# Patient Record
Sex: Male | Born: 1985 | Race: White | Hispanic: No | Marital: Single | Smoking: Current every day smoker
Health system: Southern US, Community
[De-identification: ages and names within clinical notes are randomized; demographics above are authoritative.]

## PROBLEM LIST (undated history)

## (undated) ENCOUNTER — Ambulatory Visit (INDEPENDENT_AMBULATORY_CARE_PROVIDER_SITE_OTHER): Source: Home / Self Care

## (undated) DIAGNOSIS — I10 Essential (primary) hypertension: Secondary | ICD-10-CM

## (undated) HISTORY — PX: ANKLE SURGERY: SHX546

## (undated) HISTORY — PX: WRIST SURGERY: SHX841

---

## 2006-12-15 ENCOUNTER — Emergency Department (HOSPITAL_COMMUNITY): Admission: EM | Admit: 2006-12-15 | Discharge: 2006-12-15 | Payer: Self-pay | Admitting: Emergency Medicine

## 2008-09-15 ENCOUNTER — Emergency Department (HOSPITAL_COMMUNITY): Admission: EM | Admit: 2008-09-15 | Discharge: 2008-09-15 | Payer: Self-pay | Admitting: Emergency Medicine

## 2009-12-05 ENCOUNTER — Emergency Department (HOSPITAL_COMMUNITY): Admission: EM | Admit: 2009-12-05 | Discharge: 2009-12-05 | Payer: Self-pay | Admitting: Emergency Medicine

## 2009-12-10 ENCOUNTER — Ambulatory Visit (HOSPITAL_COMMUNITY): Admission: RE | Admit: 2009-12-10 | Discharge: 2009-12-10 | Payer: Self-pay | Admitting: Family Medicine

## 2011-04-30 ENCOUNTER — Ambulatory Visit (INDEPENDENT_AMBULATORY_CARE_PROVIDER_SITE_OTHER): Payer: 59 | Admitting: Family Medicine

## 2011-04-30 VITALS — BP 139/98 | HR 86 | Temp 98.2°F | Resp 18 | Ht 69.0 in | Wt 199.0 lb

## 2011-04-30 DIAGNOSIS — M545 Low back pain, unspecified: Secondary | ICD-10-CM

## 2011-04-30 DIAGNOSIS — M546 Pain in thoracic spine: Secondary | ICD-10-CM

## 2011-04-30 MED ORDER — MELOXICAM 7.5 MG PO TABS: 7.5000 mg | ORAL_TABLET | Freq: Two times a day (BID) | ORAL | Status: DC

## 2011-04-30 MED ORDER — HYDROCODONE-ACETAMINOPHEN 5-500 MG PO CAPS: 1.0000 | ORAL_CAPSULE | Freq: Three times a day (TID) | ORAL | Status: AC | PRN

## 2011-04-30 MED ORDER — KETOROLAC TROMETHAMINE 60 MG/2ML IM SOLN
60.0000 mg | Freq: Once | INTRAMUSCULAR | Status: AC
  Administered 2011-04-30: 60 mg via INTRAMUSCULAR

## 2011-04-30 MED ORDER — CYCLOBENZAPRINE HCL 10 MG PO TABS: 10.0000 mg | ORAL_TABLET | Freq: Three times a day (TID) | ORAL | Status: AC | PRN

## 2011-04-30 NOTE — Progress Notes (Signed)
  Subjective:    Patient ID: Dwayne Mason, male    DOB: [birth year], 26 y.o.   MRN: [id number]  HPI  Patient presents with complains of severe (L) upper thoracic spine pain. Helping with a move developed dull symp then yesterday lifted cooler developed  severe searing pain in that area. Pain with ROM   Drank 1 bottle of wine yesterday to help with pain  Denies neck pain or symptoms radiating to (B) UE  SH/ smoker         Review of Systems     Objective:   Physical Exam  Constitutional: He appears well-developed.  Neck: Neck supple.  Cardiovascular: Normal rate, regular rhythm and normal heart sounds.   Pulmonary/Chest: Effort normal and breath sounds normal.  Musculoskeletal:       Thoracic back: He exhibits decreased range of motion, tenderness and spasm.       Back:  Neurological: He is alert. He has normal strength. No sensory deficit.  Reflex Scores:      Bicep reflexes are 2+ on the right side and 2+ on the left side.      Brachioradialis reflexes are 1+ on the right side and 1+ on the left side.         Assessment & Plan:   1. Back pain, thoracic  ketorolac (TORADOL) injection 60 mg, hydrocodone-acetaminophen (LORCET-HD) 5-500 MG per capsule, cyclobenzaprine (FLEXERIL) 10 MG tablet, meloxicam (MOBIC) 7.5 MG tablet   Anticipatory guidance RTC in 3 days if symptoms persist and we'll obtain x rays then Patient in agreement with plan

## 2011-07-31 ENCOUNTER — Ambulatory Visit (INDEPENDENT_AMBULATORY_CARE_PROVIDER_SITE_OTHER): Payer: 59 | Admitting: Emergency Medicine

## 2011-07-31 VITALS — BP 140/90 | HR 84 | Temp 97.9°F | Resp 20 | Ht 69.0 in | Wt 198.0 lb

## 2011-07-31 DIAGNOSIS — S335XXA Sprain of ligaments of lumbar spine, initial encounter: Secondary | ICD-10-CM

## 2011-07-31 DIAGNOSIS — M546 Pain in thoracic spine: Secondary | ICD-10-CM

## 2011-07-31 MED ORDER — CYCLOBENZAPRINE HCL 10 MG PO TABS: 10.0000 mg | ORAL_TABLET | Freq: Three times a day (TID) | ORAL | Status: AC | PRN

## 2011-07-31 MED ORDER — MELOXICAM 7.5 MG PO TABS: 7.5000 mg | ORAL_TABLET | Freq: Two times a day (BID) | ORAL | Status: AC

## 2011-07-31 MED ORDER — KETOROLAC TROMETHAMINE 60 MG/2ML IM SOLN
60.0000 mg | Freq: Once | INTRAMUSCULAR | Status: AC
  Administered 2011-07-31: 60 mg via INTRAMUSCULAR

## 2011-07-31 MED ORDER — HYDROCODONE-ACETAMINOPHEN 5-500 MG PO TABS: 1.0000 | ORAL_TABLET | ORAL | Status: AC | PRN
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
## 2012-08-08 ENCOUNTER — Emergency Department (HOSPITAL_COMMUNITY): Payer: 59

## 2012-08-08 ENCOUNTER — Emergency Department (HOSPITAL_COMMUNITY)
Admission: EM | Admit: 2012-08-08 | Discharge: 2012-08-09 | Disposition: A | Discharge status: Home / Self Care | Payer: 59 | Attending: Emergency Medicine | Admitting: Emergency Medicine

## 2012-08-08 ENCOUNTER — Encounter (HOSPITAL_COMMUNITY): Payer: Self-pay | Admitting: Emergency Medicine

## 2012-08-08 DIAGNOSIS — S66909A Unspecified injury of unspecified muscle, fascia and tendon at wrist and hand level, unspecified hand, initial encounter: Secondary | ICD-10-CM | POA: Insufficient documentation

## 2012-08-08 DIAGNOSIS — F17200 Nicotine dependence, unspecified, uncomplicated: Secondary | ICD-10-CM | POA: Insufficient documentation

## 2012-08-08 DIAGNOSIS — S61509A Unspecified open wound of unspecified wrist, initial encounter: Secondary | ICD-10-CM | POA: Insufficient documentation

## 2012-08-08 DIAGNOSIS — S61511A Laceration without foreign body of right wrist, initial encounter: Secondary | ICD-10-CM

## 2012-08-08 DIAGNOSIS — Y929 Unspecified place or not applicable: Secondary | ICD-10-CM | POA: Insufficient documentation

## 2012-08-08 DIAGNOSIS — W268XXA Contact with other sharp object(s), not elsewhere classified, initial encounter: Secondary | ICD-10-CM | POA: Insufficient documentation

## 2012-08-08 DIAGNOSIS — Y939 Activity, unspecified: Secondary | ICD-10-CM | POA: Insufficient documentation

## 2012-08-08 DIAGNOSIS — R209 Unspecified disturbances of skin sensation: Secondary | ICD-10-CM | POA: Insufficient documentation

## 2012-08-08 MED ORDER — SODIUM CHLORIDE 0.9 % IV SOLN
Freq: Once | INTRAVENOUS | Status: AC
  Administered 2012-08-08: via INTRAVENOUS

## 2012-08-08 MED ORDER — ONDANSETRON HCL 4 MG/2ML IJ SOLN
4.0000 mg | Freq: Once | INTRAMUSCULAR | Status: AC
  Administered 2012-08-08: 4 mg via INTRAVENOUS
  Filled 2012-08-08: qty 2

## 2012-08-08 MED ORDER — MORPHINE SULFATE 4 MG/ML IJ SOLN
4.0000 mg | Freq: Once | INTRAMUSCULAR | Status: AC
  Administered 2012-08-08: 4 mg via INTRAVENOUS
  Filled 2012-08-08: qty 1

## 2012-08-08 MED ORDER — CEFAZOLIN SODIUM 1-5 GM-% IV SOLN
1.0000 g | Freq: Once | INTRAVENOUS | Status: AC
  Administered 2012-08-09: 1 g via INTRAVENOUS
  Filled 2012-08-08: qty 50

## 2012-08-08 NOTE — ED Notes (Signed)
PT. PRESENTS WITH RIGHT LATERAL WRIST LACERATION APPROX. 1 1/2 INCH SUSTAINED WHILE WASHING DISHES THIS EVENING , ETOH INTAKE PTA , DRESSING APPLIED AT TRIAGE .

## 2012-08-09 MED ORDER — HYDROCODONE-ACETAMINOPHEN 5-325 MG PO TABS: 1.0000 | ORAL_TABLET | ORAL | Status: DC | PRN

## 2012-08-09 MED ORDER — CEPHALEXIN 500 MG PO CAPS: 500.0000 mg | ORAL_CAPSULE | Freq: Four times a day (QID) | ORAL | Status: DC

## 2012-08-09 NOTE — ED Provider Notes (Signed)
CSN: [id number]     Arrival date & time 08/08/12  2157 History     First MD Initiated Contact with Patient 08/08/12 2302     Chief Complaint  Patient presents with  . Laceration   (Consider location/radiation/quality/duration/timing/severity/associated sxs/prior Treatment) HPI Comments: Patient is a 4 cm laceration to the dorsal aspect of his right forearm, proximally, 4 cm above the wrist joint on the radial aspect.  He does have some delay flexion at the wrist.  He has numbness and tingling to the thumb and index, finger.  Circulation appears to be normal moderate amount of bleeding to the lateral aspect of the wound.  Controlled with pressure  Patient is a 27 y.o. male presenting with skin laceration. The history is provided by the patient.  Laceration Location:  Shoulder/arm Shoulder/arm laceration location:  R forearm Length (cm):  4 Depth:  Through underlying tissue Quality: straight   Bleeding: controlled with pressure   Time since incident:  2 hours Laceration mechanism:  Broken glass Pain details:    Quality:  Aching   Severity:  Mild   Timing:  Constant Foreign body present:  No foreign bodies Relieved by:  Pressure Tetanus status:  Up to date   History reviewed. No pertinent past medical history. Past Surgical History  Procedure Laterality Date  . Ankle surgery     No family history on file. History  Substance Use Topics  . Smoking status: Current Every Day Smoker  . Smokeless tobacco: Not on file  . Alcohol Use: 0.0 oz/week     Comment: weekends    Review of Systems  Skin: Positive for wound.  Neurological: Positive for numbness.  All other systems reviewed and are negative.    Allergies  Review of patient's allergies indicates no known allergies.  Home Medications   Current Outpatient Rx  Name  Route  Sig  Dispense  Refill  . cephALEXin (KEFLEX) 500 MG capsule   Oral   Take 1 capsule (500 mg total) by mouth 4 (four) times daily.   40  capsule   0   . HYDROcodone-acetaminophen (NORCO/VICODIN) 5-325 MG per tablet   Oral   Take 1 tablet by mouth every 4 (four) hours as needed for pain.   30 tablet   0    BP 138/97  Pulse 109  Temp(Src) 98.3 F (36.8 C) (Oral)  Resp 20  SpO2 95% Physical Exam  Nursing note and vitals reviewed. Constitutional: He is oriented to person, place, and time. He appears well-developed and well-nourished.  Eyes: Pupils are equal, round, and reactive to light.  Cardiovascular: Normal rate and regular rhythm.   Pulmonary/Chest: Effort normal and breath sounds normal.  Musculoskeletal: He exhibits tenderness. He exhibits no edema.       Right hand: Decreased sensation noted. Decreased sensation is present in the radial distribution. Decreased strength noted. He exhibits wrist extension trouble.       Hands: 4 cm laceration Numbness and tingling to the thumb and index, finger, for range of motion of the thumb and finger.  Delay extension at the wrist.  On visualization of the laceration.  It appears that the radius, cough, or radius longus tendon.  Has been partially severed  Neurological: He is alert and oriented to person, place, and time.  Skin: Skin is warm.    ED Course   LACERATION REPAIR Date/Time: 08/09/2012 12:34 AM Performed by: Arman Filter Authorized by: Arman Filter Consent: Verbal consent obtained. Consent given by: patient  Patient understanding: patient states understanding of the procedure being performed Patient identity confirmed: verbally with patient Foreign bodies: no foreign bodies Tendon involvement: superficial Nerve involvement: superficial Vascular damage: no Anesthesia: local infiltration Local anesthetic: lidocaine 1% with epinephrine Anesthetic total: 5 ml Patient sedated: no Preparation: Patient was prepped and draped in the usual sterile fashion. Irrigation solution: saline Irrigation method: syringe Amount of cleaning: extensive Debridement:  none Degree of undermining: none Skin closure: 3-0 Prolene Number of sutures: 4 Technique: simple Approximation: loose Approximation difficulty: simple Dressing: antibiotic ointment, 4x4 sterile gauze and splint Patient tolerance: Patient tolerated the procedure well with no immediate complications. Comments: After suturing, dressing and splint applied.  Patient retained Refill in all 5 fingers.  Pain  has been decreased   (including critical care time)  Labs Reviewed - No data to display Dg Wrist Complete Right  08/08/2012   *RADIOLOGY REPORT*  Clinical Data: Right wrist laceration.  RIGHT WRIST - COMPLETE 3+ VIEW  Comparison: None.  Findings: Bandage material is present over the radial aspect of the wrist.  There is no fracture or radiopaque foreign body.  Anatomic alignment.  IMPRESSION: No acute osseous abnormality.   Original Report Authenticated By: Andreas Newport, M.D.   1. Laceration of wrist with complication, right, initial encounter     MDM   I spoke with Dr. Dairl Ponder, to discuss closure.  He agrees a loose closure.  Irrigation IV antibiotic in the form of Ancef 1 g.  Patient said to be placed in a splint to decrease motion.  Followup in the office in the morning  Arman Filter, NP 08/09/12 [phone]

## 2012-08-09 NOTE — ED Notes (Signed)
Pt given glass of water per PA approval.

## 2012-08-09 NOTE — Progress Notes (Signed)
Orthopedic Tech Progress Note Patient Details:  Dwayne Mason 1985/05/10 [id number]  Ortho Devices Type of Ortho Device: Thumb spica splint   Haskell Flirt 08/09/2012, 12:26 AM

## 2012-08-10 NOTE — ED Provider Notes (Signed)
Medical screening examination/treatment/procedure(s) were performed by non-physician practitioner and as supervising physician I was immediately available for consultation/collaboration.  Sunnie Nielsen, MD 08/10/12 [phone]

## 2013-03-15 ENCOUNTER — Ambulatory Visit (INDEPENDENT_AMBULATORY_CARE_PROVIDER_SITE_OTHER): Payer: 59 | Admitting: Family Medicine

## 2013-03-15 ENCOUNTER — Ambulatory Visit: Payer: 59

## 2013-03-15 VITALS — BP 104/82 | HR 78 | Temp 98.6°F | Resp 16 | Ht 69.0 in | Wt 224.4 lb

## 2013-03-15 DIAGNOSIS — M109 Gout, unspecified: Secondary | ICD-10-CM

## 2013-03-15 DIAGNOSIS — M79609 Pain in unspecified limb: Secondary | ICD-10-CM

## 2013-03-15 DIAGNOSIS — M79671 Pain in right foot: Secondary | ICD-10-CM

## 2013-03-15 LAB — POCT CBC
GRANULOCYTE PERCENT: 67.7 % (ref 37–80)
HCT, POC: 50.6 % (ref 43.5–53.7)
Hemoglobin: 16.9 g/dL (ref 14.1–18.1)
Lymph, poc: 1.7 (ref 0.6–3.4)
MCH, POC: 31.5 pg — AB (ref 27–31.2)
MCHC: 33.4 g/dL (ref 31.8–35.4)
MCV: 94.2 fL (ref 80–97)
MID (CBC): 0.8 (ref 0–0.9)
MPV: 9 fL (ref 0–99.8)
PLATELET COUNT, POC: 233 10*3/uL (ref 142–424)
POC GRANULOCYTE: 5.3 (ref 2–6.9)
POC LYMPH %: 22 % (ref 10–50)
POC MID %: 10.3 %M (ref 0–12)
RBC: 5.37 M/uL (ref 4.69–6.13)
RDW, POC: 13.7 %
WBC: 7.8 10*3/uL (ref 4.6–10.2)

## 2013-03-15 LAB — COMPREHENSIVE METABOLIC PANEL
ALBUMIN: 4.4 g/dL (ref 3.5–5.2)
ALK PHOS: 68 U/L (ref 39–117)
ALT: 24 U/L (ref 0–53)
AST: 20 U/L (ref 0–37)
BUN: 15 mg/dL (ref 6–23)
CALCIUM: 9.8 mg/dL (ref 8.4–10.5)
CHLORIDE: 103 meq/L (ref 96–112)
CO2: 27 mEq/L (ref 19–32)
Creat: 0.84 mg/dL (ref 0.50–1.35)
Glucose, Bld: 87 mg/dL (ref 70–99)
POTASSIUM: 5 meq/L (ref 3.5–5.3)
SODIUM: 137 meq/L (ref 135–145)
TOTAL PROTEIN: 7.7 g/dL (ref 6.0–8.3)
Total Bilirubin: 0.6 mg/dL (ref 0.2–1.2)

## 2013-03-15 LAB — URIC ACID: Uric Acid, Serum: 7 mg/dL (ref 4.0–7.8)

## 2013-03-15 MED ORDER — INDOMETHACIN 50 MG PO CAPS: 50.0000 mg | ORAL_CAPSULE | Freq: Three times a day (TID) | ORAL | Status: DC

## 2013-03-15 NOTE — Progress Notes (Signed)
Subjective: 28 year old man who started having intense pain in his right foot between the third and fourth toe joints when he got up yesterday morning. He says he may have had a little pain the day before but nothing to think about. He did have a heavy meat meal with beer the day before. 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 he did work yesterday though he was miserable. He knows of no specific injury. He wears steel toed boots when he is at work. Healthy person, nonemergent meds.  Objective: His feet have a little bit of a red flush around the base of the foot and some dryness. He is extremely tender at the second and third MTP joint. Lateral pressure did not seem to cause a lot of pain. Direct palpation or plantar flexion of the toes hurts terribly. Pulses intact  Assessment: Foot pain, suspicious for gout. I doubt Morton's neuroma as the pain came on little too abruptly and lateral pressure does not cause more pain.  Plan: X-ray foot CBC C. met and uric acid level  Results for orders placed in visit on 03/15/13  POCT CBC      Result Value Ref Range   WBC 7.8  4.6 - 10.2 K/uL   Lymph, poc 1.7  0.6 - 3.4   POC LYMPH PERCENT 22.0  10 - 50 %L   MID (cbc) 0.8  0 - 0.9   POC MID % 10.3  0 - 12 %M   POC Granulocyte 5.3  2 - 6.9   Granulocyte percent 67.7  37 - 80 %G   RBC 5.37  4.69 - 6.13 M/uL   Hemoglobin 16.9  14.1 - 18.1 g/dL   HCT, POC 50.6  43.5 - 53.7 %   MCV 94.2  80 - 97 fL   MCH, POC 31.5 (*) 27 - 31.2 pg   MCHC 33.4  31.8 - 35.4 g/dL   RDW, POC 13.7     Platelet Count, POC 233  142 - 424 K/uL   MPV 9.0  0 - 99.8 fL   [facility] reading (PRIMARY) by  Dr. Linna Darner Normal x-ray of foot  .

## 2013-03-15 NOTE — Patient Instructions (Signed)
Take indomethacin one pill 3 times daily with food. When the foot has been calmed down for a couple of days cut back to 2 pills daily for a few days then one daily for another few days, then discontinue it.   If symptoms recur or are getting worse despite treatment return for a recheck  Gout Gout is an inflammatory arthritis caused by a buildup of uric acid crystals in the joints. Uric acid is a chemical that is normally present in the blood. When the level of uric acid in the blood is too high it can form crystals that deposit in your joints and tissues. This causes joint redness, soreness, and swelling (inflammation). Repeat attacks are common. Over time, uric acid crystals can form into masses (tophi) near a joint, destroying bone and causing disfigurement. Gout is treatable and often preventable. CAUSES  The disease begins with elevated levels of uric acid in the blood. Uric acid is produced by your body when it breaks down a naturally found substance called purines. Certain foods you eat, such as meats and fish, contain high amounts of purines. Causes of an elevated uric acid level include:  Being passed down from parent to child (heredity).  Diseases that cause increased uric acid production (such as obesity, psoriasis, and certain cancers).  Excessive alcohol use.  Diet, especially diets rich in meat and seafood.  Medicines, including certain cancer-fighting medicines (chemotherapy), water pills (diuretics), and aspirin.  Chronic kidney disease. The kidneys are no longer able to remove uric acid well.  Problems with metabolism. Conditions strongly associated with gout include:  Obesity.  High blood pressure.  High cholesterol.  Diabetes. Not everyone with elevated uric acid levels gets gout. It is not understood why some people get gout and others do not. Surgery, joint injury, and eating too much of certain foods are some of the factors that can lead to gout attacks. SYMPTOMS     An attack of gout comes on quickly. It causes intense pain with redness, swelling, and warmth in a joint.  Fever can occur.  Often, only one joint is involved. Certain joints are more commonly involved:  Base of the big toe.  Knee.  Ankle.  Wrist.  Finger. Without treatment, an attack usually goes away in a few days to weeks. Between attacks, you usually will not have symptoms, which is different from many other forms of arthritis. DIAGNOSIS  Your caregiver will suspect gout based on your symptoms and exam. In some cases, tests may be recommended. The tests may include:  Blood tests.  Urine tests.  X-rays.  Joint fluid exam. This exam requires a needle to remove fluid from the joint (arthrocentesis). Using a microscope, gout is confirmed when uric acid crystals are seen in the joint fluid. TREATMENT  There are two phases to gout treatment: treating the sudden onset (acute) attack and preventing attacks (prophylaxis).  Treatment of an Acute Attack.  Medicines are used. These include anti-inflammatory medicines or steroid medicines.  An injection of steroid medicine into the affected joint is sometimes necessary.  The painful joint is rested. Movement can worsen the arthritis.  You may use warm or cold treatments on painful joints, depending which works best for you.  Treatment to Prevent Attacks.  If you suffer from frequent gout attacks, your caregiver may advise preventive medicine. These medicines are started after the acute attack subsides. These medicines either help your kidneys eliminate uric acid from your body or decrease your uric acid production. You may  need to stay on these medicines for a very long time.  The early phase of treatment with preventive medicine can be associated with an increase in acute gout attacks. For this reason, during the first few months of treatment, your caregiver may also advise you to take medicines usually used for acute gout  treatment. Be sure you understand your caregiver's directions. Your caregiver may make several adjustments to your medicine dose before these medicines are effective.  Discuss dietary treatment with your caregiver or dietitian. Alcohol and drinks high in sugar and fructose and foods such as meat, poultry, and seafood can increase uric acid levels. Your caregiver or dietician can advise you on drinks and foods that should be limited. HOME CARE INSTRUCTIONS   Do not take aspirin to relieve pain. This raises uric acid levels.  Only take over-the-counter or prescription medicines for pain, discomfort, or fever as directed by your caregiver.  Rest the joint as much as possible. When in bed, keep sheets and blankets off painful areas.  Keep the affected joint raised (elevated).  Apply warm or cold treatments to painful joints. Use of warm or cold treatments depends on which works best for you.  Use crutches if the painful joint is in your leg.  Drink enough fluids to keep your urine clear or pale yellow. This helps your body get rid of uric acid. Limit alcohol, sugary drinks, and fructose drinks.  Follow your dietary instructions. Pay careful attention to the amount of protein you eat. Your daily diet should emphasize fruits, vegetables, whole grains, and fat-free or low-fat milk products. Discuss the use of coffee, vitamin C, and cherries with your caregiver or dietician. These may be helpful in lowering uric acid levels.  Maintain a healthy body weight. SEEK MEDICAL CARE IF:   You develop diarrhea, vomiting, or any side effects from medicines.  You do not feel better in 24 hours, or you are getting worse. SEEK IMMEDIATE MEDICAL CARE IF:   Your joint becomes suddenly more tender, and you have chills or a fever. MAKE SURE YOU:   Understand these instructions.  Will watch your condition.  Will get help right away if you are not doing well or get worse. Document Released: 12/27/1999  Document Revised: 04/25/2012 Document Reviewed: 08/12/2011 [facility] Patient Information 2014 [employer] [place], [place].

## 2013-03-17 ENCOUNTER — Encounter: Payer: Self-pay | Admitting: Family Medicine

## 2014-03-22 ENCOUNTER — Ambulatory Visit (INDEPENDENT_AMBULATORY_CARE_PROVIDER_SITE_OTHER): Payer: 59 | Admitting: Physician Assistant

## 2014-03-22 VITALS — BP 114/72 | HR 70 | Temp 97.8°F | Resp 16 | Ht 69.24 in | Wt 208.0 lb

## 2014-03-22 DIAGNOSIS — M791 Myalgia, unspecified site: Secondary | ICD-10-CM

## 2014-03-22 DIAGNOSIS — H938X3 Other specified disorders of ear, bilateral: Secondary | ICD-10-CM | POA: Diagnosis not present

## 2014-03-22 DIAGNOSIS — R070 Pain in throat: Secondary | ICD-10-CM | POA: Diagnosis not present

## 2014-03-22 DIAGNOSIS — J069 Acute upper respiratory infection, unspecified: Secondary | ICD-10-CM

## 2014-03-22 LAB — POCT CBC
Granulocyte percent: 82.2 %G — AB (ref 37–80)
HEMATOCRIT: 55 % — AB (ref 43.5–53.7)
HEMOGLOBIN: 18 g/dL (ref 14.1–18.1)
LYMPH, POC: 1.9 (ref 0.6–3.4)
MCH, POC: 30.4 pg (ref 27–31.2)
MCHC: 32.7 g/dL (ref 31.8–35.4)
MCV: 93.1 fL (ref 80–97)
MID (CBC): 0.6 (ref 0–0.9)
MPV: 7.7 fL (ref 0–99.8)
PLATELET COUNT, POC: 207 10*3/uL (ref 142–424)
POC GRANULOCYTE: 11.4 — AB (ref 2–6.9)
POC LYMPH PERCENT: 13.8 %L (ref 10–50)
POC MID %: 4 % (ref 0–12)
RBC: 5.91 M/uL (ref 4.69–6.13)
RDW, POC: 14.1 %
WBC: 13.9 10*3/uL — AB (ref 4.6–10.2)

## 2014-03-22 LAB — POCT RAPID STREP A (OFFICE): RAPID STREP A SCREEN: NEGATIVE

## 2014-03-22 MED ORDER — GUAIFENESIN ER 1200 MG PO TB12: 1.0000 | ORAL_TABLET | Freq: Two times a day (BID) | ORAL | Status: DC | PRN

## 2014-03-22 MED ORDER — AMOXICILLIN-POT CLAVULANATE 875-125 MG PO TABS: 1.0000 | ORAL_TABLET | Freq: Two times a day (BID) | ORAL | Status: AC

## 2014-03-22 NOTE — Patient Instructions (Signed)
Please drink plenty of fluids 4 regular sized water bottles (64oz).    Please return if you are not feeling any improvement and worsening within 48 hours.  Watch for alarming signs such as trouble breathing, fever, shortness of breath.

## 2014-03-22 NOTE — Progress Notes (Signed)
[facility] [address], [place] [id number] [phone]  Date:  03/22/2014   Name:  Dwayne Mason   DOB:  Apr 18, 1985   MRN:  [id number]  PCP:  No PCP Per Patient    Chief Complaint: Influenza; Fatigue; Sore Throat; Generalized Body Aches; and Nasal Congestion   History of Present Illness:  Dwayne Mason is a 29 y.o. very pleasant male patient who presents with the following:  Patient reports 12 days of flu-like symptoms including sore throat, generalized body aches, nasal congestion, and ear fullness.  Patient states that the symptoms started 12 days ago, and he visited health care facility of [facility] and tested positive for influenza.  Patient states that the symptoms continued, but seemed to be resolving.  Yesterday, the body aches returned with a mild sore throat and body ache.  He has nasal congestion, but very little coughing.  He denies rash or dizziness.  He has no sob or dyspnea.  He has episodes of diarrhea without blood.  He has taken Nyquil, dayquil, and ibuprofen for relief.   Patient reports HIV testing, 1 month ago.    There are no active problems to display for this patient.   History reviewed. No pertinent past medical history.  Past Surgical History  Procedure Laterality Date  . Ankle surgery    . Wrist surgery      Nerve Repair, right wrist    History  Substance Use Topics  . Smoking status: Current Every Day Smoker  . Smokeless tobacco: Not on file  . Alcohol Use: 0.0 oz/week     Comment: weekends    Family History  Problem Relation Age of Onset  . Adopted: Yes    No Known Allergies  Medication list has been reviewed and updated.  No current outpatient prescriptions on file prior to visit.   No current facility-administered medications on file prior to visit.    Review of Systems: ROS otherwise unremarkable unless listed above.    Physical Examination: Filed Vitals:   03/22/14 1109  BP: 154/98  Pulse: 45  Temp:  97.8 F (36.6 C)  Resp: 16   Filed Vitals:   03/22/14 1109  Height: 5' 9.24" (1.759 m)  Weight: 208 lb (94.348 kg)   BP 114/72 mmHg  Pulse 70  Temp(Src) 97.8 F (36.6 C) (Oral)  Resp 16  Ht 5' 9.24" (1.759 m)  Wt 208 lb (94.348 kg)  BMI 30.49 kg/m2  SpO2 98%  Body mass index is 30.49 kg/(m^2). Ideal Body Weight: Weight in (lb) to have BMI = 25: 170.1  Physical Exam  Constitutional: He appears well-developed and well-nourished. No distress.  HENT:  Head: Normocephalic and atraumatic.  Right Ear: Tympanic membrane, external ear and ear canal normal. Tympanic membrane is not injected and not erythematous. No middle ear effusion.  Left Ear: Tympanic membrane is injected. Tympanic membrane is not scarred and not erythematous. A middle ear effusion is present.  Nose: Mucosal edema and rhinorrhea present. Right sinus exhibits no maxillary sinus tenderness and no frontal sinus tenderness. Left sinus exhibits no maxillary sinus tenderness and no frontal sinus tenderness.  Mouth/Throat: No uvula swelling. Posterior oropharyngeal erythema present. No oropharyngeal exudate or posterior oropharyngeal edema.  Eyes: EOM are normal. Pupils are equal, round, and reactive to light. Right eye exhibits no discharge. Left eye exhibits no discharge.  Skin: Skin is warm.  Psychiatric: He has a normal mood and affect. His behavior is normal.    Results for  orders placed or performed in visit on 03/22/14  POCT CBC  Result Value Ref Range   WBC 13.9 (A) 4.6 - 10.2 K/uL   Lymph, poc 1.9 0.6 - 3.4   POC LYMPH PERCENT 13.8 10 - 50 %L   MID (cbc) 0.6 0 - 0.9   POC MID % 4.0 0 - 12 %M   POC Granulocyte 11.4 (A) 2 - 6.9   Granulocyte percent 82.2 (A) 37 - 80 %G   RBC 5.91 4.69 - 6.13 M/uL   Hemoglobin 18.0 14.1 - 18.1 g/dL   HCT, POC 16.1 (A) 09.6 - 53.7 %   MCV 93.1 80 - 97 fL   MCH, POC 30.4 27 - 31.2 pg   MCHC 32.7 31.8 - 35.4 g/dL   RDW, POC 04.5 %   Platelet Count, POC 207 142 - 424 K/uL    MPV 7.7 0 - 99.8 fL  POCT rapid strep A  Result Value Ref Range   Rapid Strep A Screen Negative Negative     Assessment and Plan: 29 year old male is here today for chief complaint of ear fullness, sore throat, nasal congestion, and generalized body aches.  Lungs and vs are normal with reevaluation.  Will treat as upper respiratory infection.  Advised patient of alarming sxs of uncontrolled fever, sob, or dyspnea, which may become present with hydration.  Advised patient for smoking cessation, which likely complicated flu.    Generalized muscle ache - Plan: POCT CBC, POCT rapid strep A  Ear fullness, bilateral - Plan: POCT CBC, POCT rapid strep A  Throat pain in adult - Plan: POCT rapid strep A, Culture, Group A Strep  Upper respiratory infection - Plan: amoxicillin-clavulanate (AUGMENTIN) 875-125 MG per tablet, Guaifenesin (MUCINEX MAXIMUM STRENGTH) 1200 MG TB12  Trena Platt, PA-C [facility] 3/10/20169:02 PM

## 2014-03-24 LAB — CULTURE, GROUP A STREP: ORGANISM ID, BACTERIA: NORMAL

## 2014-12-20 ENCOUNTER — Ambulatory Visit (INDEPENDENT_AMBULATORY_CARE_PROVIDER_SITE_OTHER): Payer: 59 | Admitting: Family Medicine

## 2014-12-20 VITALS — BP 128/88 | HR 89 | Temp 98.1°F | Resp 16 | Ht 69.0 in | Wt 213.6 lb

## 2014-12-20 DIAGNOSIS — F32A Depression, unspecified: Secondary | ICD-10-CM

## 2014-12-20 DIAGNOSIS — F329 Major depressive disorder, single episode, unspecified: Secondary | ICD-10-CM

## 2014-12-20 DIAGNOSIS — F4001 Agoraphobia with panic disorder: Secondary | ICD-10-CM | POA: Diagnosis not present

## 2014-12-20 DIAGNOSIS — F4010 Social phobia, unspecified: Secondary | ICD-10-CM | POA: Diagnosis not present

## 2014-12-20 DIAGNOSIS — F309 Manic episode, unspecified: Secondary | ICD-10-CM | POA: Diagnosis not present

## 2014-12-20 LAB — TSH: TSH: 0.665 u[IU]/mL (ref 0.350–4.500)

## 2014-12-20 NOTE — Progress Notes (Addendum)
Subjective:  By signing my name below, I, Rawaa Al Rifaie, attest that this documentation has been prepared under the direction and in the presence of Blaire Hodsdon, MD.  Dwayne Mason, Medical ScribeMeredith Staggers.  1:36 PM.  I personally performed the services described in this documentation, which was scribed in my presence. The recorded information has been reviewed and considered, and addended by me as needed.    Patient ID: Dwayne Mason, male    DOB: 1985-05-25, 29 y.o.   MRN: [id number]  Chief Complaint  Patient presents with  . tested for Bi-polar disorder  . Depression    triage screening    HPI HPI Comments: Dwayne Mason is a 29 y.o. male who presents to [facility] for a referral due to a concern that he might be experiencing  bi polar disorder.   History-Pt has a history of generalized anxiety with agoraphobia. He states that he tries to avoid being involved in social situation such as crowded places, or being in new or unfamiliar areas. He states that he wants to live on a [place] by himself. Pt indicates that he noticed that his symptoms have been feeling different during the past year, especially during the winter time. Pt does live action D&D for pleasure   Symptoms- He notes that his symptoms area unpredictable in the sense that  when he wakes up in the morning he is either having much energy and motivation and he goes about his day feeling like that, and other days when he is depressed. He notes that during his very energized days he experiences inability to sleep and he usually turns to drinking excessive alcohol, "whatever he can fit in there", in addition to having episodes of irrational spending. Pt experiences these episodes about once per week every 2 weeks. He denies feeling that he is alcoholic, having DUI's, or being told that his performance has been suffering. Pt also reports that he has been experiencing panic attacks more often, and  decreased concentration that is not being able to do his work efficiently. He indicates however that these symptoms are keeping him from being able to go about his normal daily or work activities. Pt does not take any medications for his symptoms.  Pt denies suicidal thoughts, or thoughts of hurting or harming others, hallucinations, A/V/T hallucinations or delusions. I discussed benzodiazepine, but he preferred to not have this prescribed at this time until he follows up with a psychiatric.   Life events and family- Pt also reports that he has experienced major life events within the past few years such as ending a 5 year relationship about 2 years ago, and her reports a death in the family, his aunt, whom he was not really close to. Pt notes that he is not very close to any family members other than his grandparents and his twin brother. Pt states that his mother was never in the picture, and his father who had mental issues such as being suicidal has been in and out of his life.  Pt notes that his uncle committed suicide few years ago. Pt has a family history of bi polar disorders (father, and his deceased uncle). Pt does not believe that his brother is experiencing any mental health issues.   Work- Pt works in Surveyor, mining at [employer]. He started this job about 1.5 years ago. He notes that he never really enjoys doing desk jobs.    Depression screen [facility] 2/9 12/20/2014  Decreased Interest 2  Down, Depressed, Hopeless 1  PHQ - 2 Score 3  Altered sleeping 2  Tired, decreased energy 1  Change in appetite 0  Feeling bad or failure about yourself  1  Trouble concentrating 2  Moving slowly or fidgety/restless 2  Suicidal thoughts 0  PHQ-9 Score 11  Difficult doing work/chores Very difficult    There are no active problems to display for this patient.  History reviewed. No pertinent past medical history. Past Surgical History  Procedure Laterality Date  . Ankle surgery      . Wrist surgery      Nerve Repair, right wrist   No Known Allergies Prior to Admission medications   Medication Sig Start Date End Date Taking? Authorizing Provider  Guaifenesin (MUCINEX MAXIMUM STRENGTH) 1200 MG TB12 Take 1 tablet (1,200 mg total) by mouth every 12 (twelve) hours as needed. Patient not taking: Reported on 12/20/2014 03/22/14   Garnetta BuddyStephanie D English, PA   Social History   Social History  . Marital Status: Single    Spouse Name: N/A  . Number of Children: N/A  . Years of Education: N/A   Occupational History  . Not on file.   Social History Main Topics  . Smoking status: Current Every Day Smoker  . Smokeless tobacco: Not on file  . Alcohol Use: 0.0 oz/week     Comment: weekends  . Drug Use: No  . Sexual Activity: Not on file   Other Topics Concern  . Not on file   Social History Narrative    Review of Systems  Psychiatric/Behavioral: Positive for behavioral problems, dysphoric mood and decreased concentration. Negative for suicidal ideas, hallucinations and self-injury. The patient is nervous/anxious and is hyperactive.       Objective:   Physical Exam  Constitutional: He is oriented to person, place, and time. He appears well-developed and well-nourished. No distress.  HENT:  Head: Normocephalic and atraumatic.  Eyes: EOM are normal. Pupils are equal, round, and reactive to light.  Neck: Neck supple.  Cardiovascular: Normal rate.   Pulmonary/Chest: Effort normal.  Neurological: He is alert and oriented to person, place, and time. No cranial nerve deficit.  Skin: Skin is warm and dry.  Psychiatric: He has a normal mood and affect. His behavior is normal.  Faire eye contact with pyscomotor agitation though tout visit. Denies SI/HI.  Nursing note and vitals reviewed.   Filed Vitals:   12/20/14 1242  BP: 128/88  Pulse: 89  Temp: 98.1 F (36.7 C)  TempSrc: Oral  Resp: 16  Height: 5\' 9"  (1.753 m)  Weight: 213 lb 9.6 oz (96.888 kg)  SpO2: 98%        Assessment & Plan:  Dwayne MullerRyan Mason is a 29 y.o. male Manic episode (HCC) - Plan: TSH, Ambulatory referral to Psychiatry  Depression - Plan: TSH, Ambulatory referral to Psychiatry  Panic disorder with agoraphobia - Plan: TSH, Ambulatory referral to Psychiatry  Social anxiety disorder - Plan: TSH, Ambulatory referral to Psychiatry   Dwayne Mason may have a combination of diagnoses, including suspected bipolar disorder with family history of the same, underlying social anxiety disorder and panic disorder, and depressive episodes. However intermittent episodes of manic type symptoms, so suspect at least some bipolar disorder. Some decreased focus and productivity work, but he has not had any difficulties with his employer at present. Denies suicidal ideation or homicidal ideation. No psychotic symptoms. Initially discussed possible low-dose benzodiazepine short course for panic symptoms, but this was declined at present.  We'll check TSH, refer to psychiatry, also gave numbers for psychologist/counseling given his family situation and its possible impact on his current symptoms. RTC precautions discussed.   No orders of the defined types were placed in this encounter.   Patient Instructions  I will refer you to psychiatrist to further evaluate your symptoms, but I agree that your symptoms do sound like Bipolar disorder. However, you also have some symptoms of social anxiety and panic disorder. There are some medicines that can help with these symptoms, so let me know if you would like to try one of these prior to meeting with psychiatrist. Also recommend calling one of the counselors below to discuss your symptoms as well. Return to the clinic or go to the nearest emergency room if any of your symptoms worsen or new symptoms occur.  Karmen Bongo: [phone] [place] Ped: [phone]  Bipolar Disorder Bipolar disorder is a mental illness. The term bipolar disorder actually is used to describe a group  of disorders that all share varying degrees of emotional highs and lows that can interfere with daily functioning, such as work, school, or relationships. Bipolar disorder also can lead to drug abuse, hospitalization, and suicide. The emotional highs of bipolar disorder are periods of elation or irritability and high energy. These highs can range from a mild form (hypomania) to a severe form (mania). People experiencing episodes of hypomania may appear energetic, excitable, and highly productive. People experiencing mania may behave impulsively or erratically. They often make poor decisions. They may have difficulty sleeping. The most severe episodes of mania can involve having very distorted beliefs or perceptions about the world and seeing or hearing things that are not real (psychotic delusions and hallucinations).  The emotional lows of bipolar disorder (depression) also can range from mild to severe. Severe episodes of bipolar depression can involve psychotic delusions and hallucinations. Sometimes people with bipolar disorder experience a state of mixed mood. Symptoms of hypomania or mania and depression are both present during this mixed-mood episode. SIGNS AND SYMPTOMS There are signs and symptoms of the episodes of hypomania and mania as well as the episodes of depression. The signs and symptoms of hypomania and mania are similar but vary in severity. They include:  Inflated self-esteem or feeling of increased self-confidence.  Decreased need for sleep.  Unusual talkativeness (rapid or pressured speech) or the feeling of a need to keep talking.  Sensation of racing thoughts or constant talking, with quick shifts between topics that may or may not be related (flight of ideas).  Decreased ability to focus or concentrate.  Increased purposeful activity, such as work, studies, or social activity, or nonproductive activity, such as pacing, squirming and fidgeting, or finger and toe  tapping.  Impulsive behavior and use of poor judgment, resulting in high-risk activities, such as having unprotected sex or spending excessive amounts of money. Signs and symptoms of depression include the following:   Feelings of sadness, hopelessness, or helplessness.  Frequent or uncontrollable episodes of crying.  Lack of feeling anything or caring about anything.  Difficulty sleeping or sleeping too much.  Inability to enjoy the things you used to enjoy.   Desire to be alone all the time.   Feelings of guilt or worthlessness.  Lack of energy or motivation.   Difficulty concentrating, remembering, or making decisions.  Change in appetite or weight beyond normal fluctuations.  Thoughts of death or the desire to harm yourself. DIAGNOSIS  Bipolar disorder is diagnosed through an assessment by your caregiver.  Your caregiver will ask questions about your emotional episodes. There are two main types of bipolar disorder. People with type I bipolar disorder have manic episodes with or without depressive episodes. People with type II bipolar disorder have hypomanic episodes and major depressive episodes, which are more serious than mild depression. The type of bipolar disorder you have can make an important difference in how your illness is monitored and treated. Your caregiver may ask questions about your medical history and use of alcohol or drugs, including prescription medication. Certain medical conditions and substances also can cause emotional highs and lows that resemble bipolar disorder (secondary bipolar disorder).  TREATMENT  Bipolar disorder is a long-term illness. It is best controlled with continuous treatment rather than treatment only when symptoms occur. The following treatments can be prescribed for bipolar disorders:  Medication--Medication can be prescribed by a doctor that is an expert in treating mental disorders (psychiatrists). Medications called mood  stabilizers are usually prescribed to help control the illness. Other medications are sometimes added if symptoms of mania, depression, or psychotic delusions and hallucinations occur despite the use of a mood stabilizer.  Talk therapy--Some forms of talk therapy are helpful in providing support, education, and guidance. A combination of medication and talk therapy is best for managing the disorder over time. A procedure in which electricity is applied to your brain through your scalp (electroconvulsive therapy) is used in cases of severe mania when medication and talk therapy do not work or work too slowly.   This information is not intended to replace advice given to you by your health care provider. Make sure you discuss any questions you have with your health care provider.   Document Released: 04/06/2000 Document Revised: 01/19/2014 Document Reviewed: 01/25/2012 Elsevier Interactive Patient Education 2016 [employer].   Social Anxiety Disorder Social anxiety disorder, previously called social phobia, is a mental disorder. People with social anxiety disorder frequently feel nervous, afraid, or embarrassed when around other people in social situations. They constantly worry that other people are judging or criticizing them for how they look, what they say, or how they act. They may worry that other people might reject them because of their appearance or behavior. Social anxiety disorder is more than just occasional shyness or self-consciousness. It can cause severe emotional distress. It can interfere with daily life activities. Social anxiety disorder also may lead to excessive alcohol or drug use and even suicide.  Social anxiety disorder is actually one of the most common mental disorders. It can develop at any time but usually starts in the teenage years. Women are more commonly affected than men. Social anxiety disorder is also more common in people who have family members with anxiety  disorders. It also is more common in people who have physical deformities or conditions with characteristics that are obvious to others, such as stuttered speech or movement abnormalities (Parkinson disease).  SYMPTOMS  In addition to feeling anxious or fearful in social situations, people with social anxiety disorder frequently have physical symptoms. Examples include:  Red face (blushing).  Racing heart.  Sweating.  Shaky hands or voice.  Confusion.  Light-headedness.  Upset stomach and diarrhea. DIAGNOSIS  Social anxiety disorder is diagnosed through an assessment by your health care provider. Your health care provider will ask you questions about your mood, thoughts, and reactions in social situations. Your health care provider may ask you about your medical history and use of alcohol or drugs, including prescription medicines. Certain medical conditions and the use  of certain substances, including caffeine, can cause symptoms similar to social anxiety disorder. Your health care provider may refer you to a mental health specialist for further evaluation or treatment. The criteria for diagnosis of social anxiety disorder are:  Marked fear or anxiety in one or more social situations in which you may be closely watched or studied by others. Examples of such situations include:  Interacting socially (having a conversation with others, going to a party, or meeting strangers).  Being observed (eating or drinking in public or being called on in class).  Performing in front of others (giving a speech).  The social situations of concern almost always cause fear or anxiety, not just occasionally.  People with social anxiety disorder fear that they will be viewed negatively in a way that will be embarrassing, will lead to rejection, or will offend others. This fear is out of proportion to the actual threat posed by the social situation.  Often the triggering social situations are avoided,  or they are endured with intense fear or anxiety. The fear, anxiety, or avoidance is persistent and lasts for 6 months or longer.  The anxiety causes difficulty functioning in at least some parts of your daily life. TREATMENT  Several types of treatment are available for social anxiety disorder. These treatments are often used in combination and include:   Talk therapy. Group talk therapy allows you to see that you are not alone with these problems. Individual talk therapy helps you address your specific anxiety issues with a caring professional. The most effective forms of talk therapy for social anxiety disorder are cognitive-behavioral therapy and exposure therapy. Cognitive-behavioral therapy helps you to identify and change negative thoughts and beliefs that are at the root of the disorder. Exposure therapy allows you to gradually face the situations that you fear most.  Relaxation and coping techniques. These include deep breathing, self-talk, meditation, visual imagery, and yoga. Relaxation techniques help to keep you calm in social situations.  Social Optician, dispensing.Social skills can be learned on your own or with the help of a talk therapist. They can help you feel more confident and comfortable in social situations.  Medicine. For anxiety limited to performance situations (performance anxiety), medicine called beta blockers can help by reducing or preventing the physical symptoms of social anxiety disorder. For more persistent and generalized social anxiety, antidepressant medicine may be prescribed to help control symptoms. In severe cases of social anxiety disorder, strong antianxiety medicine, called benzodiazepines, may be prescribed on a limited basis and for a short time.   This information is not intended to replace advice given to you by your health care provider. Make sure you discuss any questions you have with your health care provider.   Document Released: 11/27/2004 Document  Revised: 01/19/2014 Document Reviewed: 03/29/2012 Elsevier Interactive Patient Education 2016 Else[employer] Inc[employer] Panic Attacks Panic attacks are sudden, short-livedsurges of severe anxiety, fear, or discomfort. They may occur for no reason when you are relaxed, when you are anxious, or when you are sleeping. Panic attacks may occur for a number of reasons:   Healthy people occasionally have panic attacks in extreme, life-threatening situations, such as war or natural disasters. Normal anxiety is a protective mechanism of the body that helps [place] react to danger (fight or flight response).  Panic attacks are often seen with anxiety disorders, such as panic disorder, social anxiety disorder, generalized anxiety disorder, and phobias. Anxiety disorders cause excessive or uncontrollable anxiety. They may interfere with your relationships or other  life activities.  Panic attacks are sometimes seen with other mental illnesses, such as depression and posttraumatic stress disorder.  Certain medical conditions, prescription medicines, and drugs of abuse can cause panic attacks. SYMPTOMS  Panic attacks start suddenly, peak within 20 minutes, and are accompanied by four or more of the following symptoms:  Pounding heart or fast heart rate (palpitations).  Sweating.  Trembling or shaking.  Shortness of breath or feeling smothered.  Feeling choked.  Chest pain or discomfort.  Nausea or strange feeling in your stomach.  Dizziness, light-headedness, or feeling like you will faint.  Chills or hot flushes.  Numbness or tingling in your lips or hands and feet.  Feeling that things are not real or feeling that you are not yourself.  Fear of losing control or going crazy.  Fear of dying. Some of these symptoms can mimic serious medical conditions. For example, you may think you are having a heart attack. Although panic attacks can be very scary, they are not life threatening. DIAGNOSIS  Panic  attacks are diagnosed through an assessment by your health care provider. Your health care provider will ask questions about your symptoms, such as where and when they occurred. Your health care provider will also ask about your medical history and use of alcohol and drugs, including prescription medicines. Your health care provider may order blood tests or other studies to rule out a serious medical condition. Your health care provider may refer you to a mental health professional for further evaluation. TREATMENT   Most healthy people who have one or two panic attacks in an extreme, life-threatening situation will not require treatment.  The treatment for panic attacks associated with anxiety disorders or other mental illness typically involves counseling with a mental health professional, medicine, or a combination of both. Your health care provider will help determine what treatment is best for you.  Panic attacks due to physical illness usually go away with treatment of the illness. If prescription medicine is causing panic attacks, talk with your health care provider about stopping the medicine, decreasing the dose, or substituting another medicine.  Panic attacks due to alcohol or drug abuse go away with abstinence. Some adults need professional help in order to stop drinking or using drugs. HOME CARE INSTRUCTIONS   Take all medicines as directed by your health care provider.   Schedule and attend follow-up visits as directed by your health care provider. It is important to keep all your appointments. SEEK MEDICAL CARE IF:  You are not able to take your medicines as prescribed.  Your symptoms do not improve or get worse. SEEK IMMEDIATE MEDICAL CARE IF:   You experience panic attack symptoms that are different than your usual symptoms.  You have serious thoughts about hurting yourself or others.  You are taking medicine for panic attacks and have a serious side effect. MAKE SURE  YOU:  Understand these instructions.  Will watch your condition.  Will get help right away if you are not doing well or get worse.   This information is not intended to replace advice given to you by your health care provider. Make sure you discuss any questions you have with your health care provider.   Document Released: 12/29/2004 Document Revised: 01/03/2013 Document Reviewed: 08/12/2012 Elsevier Interactive Patient Education [employer]

## 2014-12-20 NOTE — Patient Instructions (Signed)
I will refer you to psychiatrist to further evaluate your symptoms, but I agree that your symptoms do sound like Bipolar disorder. However, you also have some symptoms of social anxiety and panic disorder. There are some medicines that can help with these symptoms, so let me know if you would like to try one of these prior to meeting with psychiatrist. Also recommend calling one of the counselors below to discuss your symptoms as well. Return to the clinic or go to the nearest emergency room if any of your symptoms worsen or new symptoms occur.  Karmen Bongo: [phone] [place] Ped: [phone]  Bipolar Disorder Bipolar disorder is a mental illness. The term bipolar disorder actually is used to describe a group of disorders that all share varying degrees of emotional highs and lows that can interfere with daily functioning, such as work, school, or relationships. Bipolar disorder also can lead to drug abuse, hospitalization, and suicide. The emotional highs of bipolar disorder are periods of elation or irritability and high energy. These highs can range from a mild form (hypomania) to a severe form (mania). People experiencing episodes of hypomania may appear energetic, excitable, and highly productive. People experiencing mania may behave impulsively or erratically. They often make poor decisions. They may have difficulty sleeping. The most severe episodes of mania can involve having very distorted beliefs or perceptions about the world and seeing or hearing things that are not real (psychotic delusions and hallucinations).  The emotional lows of bipolar disorder (depression) also can range from mild to severe. Severe episodes of bipolar depression can involve psychotic delusions and hallucinations. Sometimes people with bipolar disorder experience a state of mixed mood. Symptoms of hypomania or mania and depression are both present during this mixed-mood episode. SIGNS AND SYMPTOMS There are signs and  symptoms of the episodes of hypomania and mania as well as the episodes of depression. The signs and symptoms of hypomania and mania are similar but vary in severity. They include:  Inflated self-esteem or feeling of increased self-confidence.  Decreased need for sleep.  Unusual talkativeness (rapid or pressured speech) or the feeling of a need to keep talking.  Sensation of racing thoughts or constant talking, with quick shifts between topics that may or may not be related (flight of ideas).  Decreased ability to focus or concentrate.  Increased purposeful activity, such as work, studies, or social activity, or nonproductive activity, such as pacing, squirming and fidgeting, or finger and toe tapping.  Impulsive behavior and use of poor judgment, resulting in high-risk activities, such as having unprotected sex or spending excessive amounts of money. Signs and symptoms of depression include the following:   Feelings of sadness, hopelessness, or helplessness.  Frequent or uncontrollable episodes of crying.  Lack of feeling anything or caring about anything.  Difficulty sleeping or sleeping too much.  Inability to enjoy the things you used to enjoy.   Desire to be alone all the time.   Feelings of guilt or worthlessness.  Lack of energy or motivation.   Difficulty concentrating, remembering, or making decisions.  Change in appetite or weight beyond normal fluctuations.  Thoughts of death or the desire to harm yourself. DIAGNOSIS  Bipolar disorder is diagnosed through an assessment by your caregiver. Your caregiver will ask questions about your emotional episodes. There are two main types of bipolar disorder. People with type I bipolar disorder have manic episodes with or without depressive episodes. People with type II bipolar disorder have hypomanic episodes and major depressive episodes, which  are more serious than mild depression. The type of bipolar disorder you have  can make an important difference in how your illness is monitored and treated. Your caregiver may ask questions about your medical history and use of alcohol or drugs, including prescription medication. Certain medical conditions and substances also can cause emotional highs and lows that resemble bipolar disorder (secondary bipolar disorder).  TREATMENT  Bipolar disorder is a long-term illness. It is best controlled with continuous treatment rather than treatment only when symptoms occur. The following treatments can be prescribed for bipolar disorders:  Medication--Medication can be prescribed by a doctor that is an expert in treating mental disorders (psychiatrists). Medications called mood stabilizers are usually prescribed to help control the illness. Other medications are sometimes added if symptoms of mania, depression, or psychotic delusions and hallucinations occur despite the use of a mood stabilizer.  Talk therapy--Some forms of talk therapy are helpful in providing support, education, and guidance. A combination of medication and talk therapy is best for managing the disorder over time. A procedure in which electricity is applied to your brain through your scalp (electroconvulsive therapy) is used in cases of severe mania when medication and talk therapy do not work or work too slowly.   This information is not intended to replace advice given to you by your health care provider. Make sure you discuss any questions you have with your health care provider.   Document Released: 04/06/2000 Document Revised: 01/19/2014 Document Reviewed: 01/25/2012 Elsevier Interactive Patient Education 2016 [employer].   Social Anxiety Disorder Social anxiety disorder, previously called social phobia, is a mental disorder. People with social anxiety disorder frequently feel nervous, afraid, or embarrassed when around other people in social situations. They constantly worry that other people are judging  or criticizing them for how they look, what they say, or how they act. They may worry that other people might reject them because of their appearance or behavior. Social anxiety disorder is more than just occasional shyness or self-consciousness. It can cause severe emotional distress. It can interfere with daily life activities. Social anxiety disorder also may lead to excessive alcohol or drug use and even suicide.  Social anxiety disorder is actually one of the most common mental disorders. It can develop at any time but usually starts in the teenage years. Women are more commonly affected than men. Social anxiety disorder is also more common in people who have family members with anxiety disorders. It also is more common in people who have physical deformities or conditions with characteristics that are obvious to others, such as stuttered speech or movement abnormalities (Parkinson disease).  SYMPTOMS  In addition to feeling anxious or fearful in social situations, people with social anxiety disorder frequently have physical symptoms. Examples include:  Red face (blushing).  Racing heart.  Sweating.  Shaky hands or voice.  Confusion.  Light-headedness.  Upset stomach and diarrhea. DIAGNOSIS  Social anxiety disorder is diagnosed through an assessment by your health care provider. Your health care provider will ask you questions about your mood, thoughts, and reactions in social situations. Your health care provider may ask you about your medical history and use of alcohol or drugs, including prescription medicines. Certain medical conditions and the use of certain substances, including caffeine, can cause symptoms similar to social anxiety disorder. Your health care provider may refer you to a mental health specialist for further evaluation or treatment. The criteria for diagnosis of social anxiety disorder are:  Marked fear or anxiety in  one or more social situations in which you may be  closely watched or studied by others. Examples of such situations include:  Interacting socially (having a conversation with others, going to a party, or meeting strangers).  Being observed (eating or drinking in public or being called on in class).  Performing in front of others (giving a speech).  The social situations of concern almost always cause fear or anxiety, not just occasionally.  People with social anxiety disorder fear that they will be viewed negatively in a way that will be embarrassing, will lead to rejection, or will offend others. This fear is out of proportion to the actual threat posed by the social situation.  Often the triggering social situations are avoided, or they are endured with intense fear or anxiety. The fear, anxiety, or avoidance is persistent and lasts for 6 months or longer.  The anxiety causes difficulty functioning in at least some parts of your daily life. TREATMENT  Several types of treatment are available for social anxiety disorder. These treatments are often used in combination and include:   Talk therapy. Group talk therapy allows you to see that you are not alone with these problems. Individual talk therapy helps you address your specific anxiety issues with a caring professional. The most effective forms of talk therapy for social anxiety disorder are cognitive-behavioral therapy and exposure therapy. Cognitive-behavioral therapy helps you to identify and change negative thoughts and beliefs that are at the root of the disorder. Exposure therapy allows you to gradually face the situations that you fear most.  Relaxation and coping techniques. These include deep breathing, self-talk, meditation, visual imagery, and yoga. Relaxation techniques help to keep you calm in social situations.  Social Optician, dispensingskills training.Social skills can be learned on your own or with the help of a talk therapist. They can help you feel more confident and comfortable in social  situations.  Medicine. For anxiety limited to performance situations (performance anxiety), medicine called beta blockers can help by reducing or preventing the physical symptoms of social anxiety disorder. For more persistent and generalized social anxiety, antidepressant medicine may be prescribed to help control symptoms. In severe cases of social anxiety disorder, strong antianxiety medicine, called benzodiazepines, may be prescribed on a limited basis and for a short time.   This information is not intended to replace advice given to you by your health care provider. Make sure you discuss any questions you have with your health care provider.   Document Released: 11/27/2004 Document Revised: 01/19/2014 Document Reviewed: 03/29/2012 Elsevier Interactive Patient Education 2016 [employer] Panic Attacks Panic attacks are sudden, short-livedsurges of severe anxiety, fear, or discomfort. They may occur for no reason when you are relaxed, when you are anxious, or when you are sleeping. Panic attacks may occur for a number of reasons:   Healthy people occasionally have panic attacks in extreme, life-threatening situations, such as war or natural disasters. Normal anxiety is a protective mechanism of the body that helps us react to danger (fight or flight response).  Panic attacks are often seen with anxiety disorders, such as panic disorder, social anxiety disorder, generalized anxiety disorder, and phobias. Anxiety disorders cause excessive or uncontrollable anxiety. They may interfere with your relationships or other life activities.  Panic attacks are sometimes seen with other mental illnesses, such as depression and posttraumatic stress disorder.  Certain medical conditions, prescription medicines, and drugs of abuse can cause panic attacks. SYMPTOMS  Panic attacks start suddenly, peak within 20 minutes, and are  accompanied by four or more of the following symptoms:  Pounding heart or fast  heart rate (palpitations).  Sweating.  Trembling or shaking.  Shortness of breath or feeling smothered.  Feeling choked.  Chest pain or discomfort.  Nausea or strange feeling in your stomach.  Dizziness, light-headedness, or feeling like you will faint.  Chills or hot flushes.  Numbness or tingling in your lips or hands and feet.  Feeling that things are not real or feeling that you are not yourself.  Fear of losing control or going crazy.  Fear of dying. Some of these symptoms can mimic serious medical conditions. For example, you may think you are having a heart attack. Although panic attacks can be very scary, they are not life threatening. DIAGNOSIS  Panic attacks are diagnosed through an assessment by your health care provider. Your health care provider will ask questions about your symptoms, such as where and when they occurred. Your health care provider will also ask about your medical history and use of alcohol and drugs, including prescription medicines. Your health care provider may order blood tests or other studies to rule out a serious medical condition. Your health care provider may refer you to a mental health professional for further evaluation. TREATMENT   Most healthy people who have one or two panic attacks in an extreme, life-threatening situation will not require treatment.  The treatment for panic attacks associated with anxiety disorders or other mental illness typically involves counseling with a mental health professional, medicine, or a combination of both. Your health care provider will help determine what treatment is best for you.  Panic attacks due to physical illness usually go away with treatment of the illness. If prescription medicine is causing panic attacks, talk with your health care provider about stopping the medicine, decreasing the dose, or substituting another medicine.  Panic attacks due to alcohol or drug abuse go away with abstinence.  Some adults need professional help in order to stop drinking or using drugs. HOME CARE INSTRUCTIONS   Take all medicines as directed by your health care provider.   Schedule and attend follow-up visits as directed by your health care provider. It is important to keep all your appointments. SEEK MEDICAL CARE IF:  You are not able to take your medicines as prescribed.  Your symptoms do not improve or get worse. SEEK IMMEDIATE MEDICAL CARE IF:   You experience panic attack symptoms that are different than your usual symptoms.  You have serious thoughts about hurting yourself or others.  You are taking medicine for panic attacks and have a serious side effect. MAKE SURE YOU:  Understand these instructions.  Will watch your condition.  Will get help right away if you are not doing well or get worse.   This information is not intended to replace advice given to you by your health care provider. Make sure you discuss any questions you have with your health care provider.   Document Released: 12/29/2004 Document Revised: 01/03/2013 Document Reviewed: 08/12/2012 Elsevier Interactive Patient Education [employer]

## 2015-05-09 ENCOUNTER — Ambulatory Visit (HOSPITAL_COMMUNITY): Payer: Self-pay | Admitting: Psychiatry

## 2017-01-17 DIAGNOSIS — J019 Acute sinusitis, unspecified: Secondary | ICD-10-CM | POA: Diagnosis not present

## 2017-10-25 ENCOUNTER — Ambulatory Visit (INDEPENDENT_AMBULATORY_CARE_PROVIDER_SITE_OTHER): Payer: 59

## 2017-10-25 ENCOUNTER — Other Ambulatory Visit: Payer: Self-pay

## 2017-10-25 ENCOUNTER — Ambulatory Visit: Payer: 59 | Admitting: Emergency Medicine

## 2017-10-25 ENCOUNTER — Encounter: Payer: Self-pay | Admitting: Emergency Medicine

## 2017-10-25 VITALS — BP 146/84 | HR 104 | Temp 98.3°F | Resp 16 | Wt 244.6 lb

## 2017-10-25 DIAGNOSIS — S93402A Sprain of unspecified ligament of left ankle, initial encounter: Secondary | ICD-10-CM

## 2017-10-25 DIAGNOSIS — M25572 Pain in left ankle and joints of left foot: Secondary | ICD-10-CM | POA: Diagnosis not present

## 2017-10-25 NOTE — Patient Instructions (Addendum)
     If you have lab work done today you will be contacted with your lab results within the next 2 weeks.  If you have not heard from us then please contact us. The fastest way to get your results is to register for My Chart.   IF you received an x-ray today, you will receive an invoice from [facility]. Please contact [facility] at [phone] with questions or concerns regarding your invoice.   IF you received labwork today, you will receive an invoice from [facility]. Please contact [facility] at [phone] with questions or concerns regarding your invoice.   Our billing staff will not be able to assist you with questions regarding bills from these companies.  You will be contacted with the lab results as soon as they are available. The fastest way to get your results is to activate your My Chart account. Instructions are located on the last page of this paperwork. If you have not heard from us regarding the results in 2 weeks, please contact this office.     Ankle Sprain An ankle sprain is a stretch or tear in one of the tough tissues (ligaments) in your ankle. Follow these instructions at home:  Rest your ankle.  Take over-the-counter and prescription medicines only as told by your doctor.  For 2-3 days, keep your ankle higher than the level of your heart (elevated) as much as possible.  If directed, put ice on the area: ? Put ice in a plastic bag. ? Place a towel between your skin and the bag. ? Leave the ice on for 20 minutes, 2-3 times a day.  If you were given a brace: ? Wear it as told. ? Take it off to shower or bathe. ? Try not to move your ankle much, but wiggle your toes from time to time. This helps to prevent swelling.  If you were given an elastic bandage (dressing): ? Take it off when you shower or bathe. ? Try not to move your ankle much, but wiggle your toes from time to time. This helps to prevent swelling. ? Adjust the bandage to make it  more comfortable if it feels too tight. ? Loosen the bandage if you lose feeling in your foot, your foot tingles, or your foot gets cold and blue.  If you have crutches, use them as told by your doctor. Continue to use them until you can walk without feeling pain in your ankle. Contact a doctor if:  Your bruises or swelling are quickly getting worse.  Your pain does not get better after you take medicine. Get help right away if:  You cannot feel your toes or foot.  Your toes or your foot looks blue.  You have very bad pain that gets worse. This information is not intended to replace advice given to you by your health care provider. Make sure you discuss any questions you have with your health care provider. Document Released: 06/17/2007 Document Revised: 06/06/2015 Document Reviewed: 07/31/2014 Elsevier Interactive Patient Education  2018 [employer] Inc.       

## 2017-10-25 NOTE — Progress Notes (Signed)
Dwayne Mason 32 y.o.   Chief Complaint  Patient presents with  . Fall    per patient off the porch last night-injury to LEFT ankle and foot with swelling    HISTORY OF PRESENT ILLNESS: This is a 32 y.o. male complaining of injury to the left ankle sustained last night.  Complaining of pain and swelling.  No other injuries or symptoms.  HPI   Prior to Admission medications   Not on File    No Known Allergies  There are no active problems to display for this patient.   No past medical history on file.  Past Surgical History:  Procedure Laterality Date  . ANKLE SURGERY    . WRIST SURGERY     Nerve Repair, right wrist    Social History   Socioeconomic History  . Marital status: Single    Spouse name: Not on file  . Number of children: Not on file  . Years of education: Not on file  . Highest education level: Not on file  Occupational History  . Not on file  Social Needs  . Financial resource strain: Not on file  . Food insecurity:    Worry: Not on file    Inability: Not on file  . Transportation needs:    Medical: Not on file    Non-medical: Not on file  Tobacco Use  . Smoking status: Current Every Day Smoker  . Smokeless tobacco: Never Used  Substance and Sexual Activity  . Alcohol use: Yes    Comment: weekends  . Drug use: No  . Sexual activity: Not on file  Lifestyle  . Physical activity:    Days per week: Not on file    Minutes per session: Not on file  . Stress: Not on file  Relationships  . Social connections:    Talks on phone: Not on file    Gets together: Not on file    Attends religious service: Not on file    Active member of club or organization: Not on file    Attends meetings of clubs or organizations: Not on file    Relationship status: Not on file  . Intimate partner violence:    Fear of current or ex partner: Not on file    Emotionally abused: Not on file    Physically abused: Not on file    Forced sexual activity: Not on file   Other Topics Concern  . Not on file  Social History Narrative  . Not on file    Family History  Adopted: Yes  Problem Relation Age of Onset  . Mental illness Father      Review of Systems  Constitutional: Negative.  Negative for fever.  Musculoskeletal: Positive for joint pain (Ankle pain).  Skin: Negative.  Negative for rash.  Neurological: Negative for dizziness and headaches.  All other systems reviewed and are negative.  Vitals:   10/25/17 0816  BP: (!) 146/84  Pulse: (!) 104  Resp: 16  Temp: 98.3 F (36.8 C)  SpO2: 96%     Physical Exam  Constitutional: He is oriented to person, place, and time. He appears well-developed and well-nourished.  HENT:  Head: Normocephalic and atraumatic.  Eyes: Pupils are equal, round, and reactive to light. EOM are normal.  Neck: Normal range of motion. Neck supple.  Cardiovascular: Normal rate and regular rhythm.  Pulmonary/Chest: Effort normal.  Musculoskeletal:  Left ankle: Positive bruising, swelling and tenderness lateral ankle with limited range of motion due to  pain.  NVI.  Neurological: He is alert and oriented to person, place, and time.  Skin: Skin is warm and dry. Capillary refill takes less than 2 seconds.  Psychiatric: He has a normal mood and affect. His behavior is normal.  Vitals reviewed.  Dg Ankle Complete Left  Result Date: 10/25/2017 CLINICAL DATA:  Joint pain in left ankle and foot EXAM: LEFT ANKLE COMPLETE - 3+ VIEW COMPARISON:  None. FINDINGS: Plantar calcaneal spur. No acute bony abnormality. Specifically, no fracture, subluxation, or dislocation. IMPRESSION: No acute bony abnormality. Electronically Signed   By: Charlett Nose M.D.   On: 10/25/2017 08:46   A total of 25 minutes was spent in the room with the patient, greater than 50% of which was in counseling/coordination of care regarding x-ray review, management, medications, prognosis and need for follow-up if no better or worse.   ASSESSMENT &  PLAN: Dustin was seen today for fall.  Diagnoses and all orders for this visit:  Pain of joint of left ankle and foot -     DG Ankle Complete Left; Future -     Apply ankle splint air cast  Sprain of left ankle, unspecified ligament, initial encounter    Patient Instructions       If you have lab work done today you will be contacted with your lab results within the next 2 weeks.  If you have not heard from [place] then please contact us. The fastest way to get your results is to register for My Chart.   IF you received an x-ray today, you will receive an invoice from [facility]. Please contact [facility] at [phone] with questions or concerns regarding your invoice.   IF you received labwork today, you will receive an invoice from [facility]. Please contact [facility] at [phone] with questions or concerns regarding your invoice.   Our billing staff will not be able to assist you with questions regarding bills from these companies.  You will be contacted with the lab results as soon as they are available. The fastest way to get your results is to activate your My Chart account. Instructions are located on the last page of this paperwork. If you have not heard from [place] regarding the results in 2 weeks, please contact this office.     Ankle Sprain An ankle sprain is a stretch or tear in one of the tough tissues (ligaments) in your ankle. Follow these instructions at home:  Rest your ankle.  Take over-the-counter and prescription medicines only as told by your doctor.  For 2-3 days, keep your ankle higher than the level of your heart (elevated) as much as possible.  If directed, put ice on the area: ? Put ice in a plastic bag. ? Place a towel between your skin and the bag. ? Leave the ice on for 20 minutes, 2-3 times a day.  If you were given a brace: ? Wear it as told. ? Take it off to shower or bathe. ? Try not to move your ankle much, but wiggle your  toes from time to time. This helps to prevent swelling.  If you were given an elastic bandage (dressing): ? Take it off when you shower or bathe. ? Try not to move your ankle much, but wiggle your toes from time to time. This helps to prevent swelling. ? Adjust the bandage to make it more comfortable if it feels too tight. ? Loosen the bandage if you lose feeling in your foot, your foot  tingles, or your foot gets cold and blue.  If you have crutches, use them as told by your doctor. Continue to use them until you can walk without feeling pain in your ankle. Contact a doctor if:  Your bruises or swelling are quickly getting worse.  Your pain does not get better after you take medicine. Get help right away if:  You cannot feel your toes or foot.  Your toes or your foot looks blue.  You have very bad pain that gets worse. This information is not intended to replace advice given to you by your health care provider. Make sure you discuss any questions you have with your health care provider. Document Released: 06/17/2007 Document Revised: 06/06/2015 Document Reviewed: 07/31/2014 Elsevier Interactive Patient Education  2018 [employer]      Edwina Barth, MD [facility]

## 2018-03-07 DIAGNOSIS — H00019 Hordeolum externum unspecified eye, unspecified eyelid: Secondary | ICD-10-CM | POA: Diagnosis not present

## 2018-03-24 ENCOUNTER — Ambulatory Visit: Payer: Self-pay

## 2018-03-24 DIAGNOSIS — H5789 Other specified disorders of eye and adnexa: Secondary | ICD-10-CM | POA: Diagnosis not present

## 2018-03-24 DIAGNOSIS — H01009 Unspecified blepharitis unspecified eye, unspecified eyelid: Secondary | ICD-10-CM | POA: Diagnosis not present

## 2018-03-24 DIAGNOSIS — Z6839 Body mass index (BMI) 39.0-39.9, adult: Secondary | ICD-10-CM | POA: Diagnosis not present

## 2018-03-24 DIAGNOSIS — H00024 Hordeolum internum left upper eyelid: Secondary | ICD-10-CM | POA: Diagnosis not present

## 2018-03-24 DIAGNOSIS — R030 Elevated blood-pressure reading, without diagnosis of hypertension: Secondary | ICD-10-CM | POA: Diagnosis not present

## 2018-03-24 NOTE — Telephone Encounter (Signed)
Pt called stating that when he go up this AM he had bloody yellow discharge from his left eye.  He states he was seen last week for swelling of his lower eye area at urgent care and give medication. He states that the problem had cleared but has come back to his upper lid swelling.  He denies injury to his eye. He states that the sclera is red and irritated. He has no other symptoms.  Pt will go to urgent care for evaluation of his symptoms. No appointments available with office. Care instructions read to patient. Pt verbalized understanding of all instructions. Reason for Disposition . MODERATE eye pain (e.g., interferes with normal activities)  Answer Assessment - Initial Assessment Questions 1. EYE DISCHARGE: "Is the discharge in one or both eyes?" "What color is it?" "How much is there?" "When did the discharge start?"      Red and yellow left eye with swelling 2. REDNESS OF SCLERA: "Is the redness in one or both eyes?" "When did the redness start?"      Redness to left sclera 3. EYELIDS: "Are the eyelids red or swollen?" If so, ask: "How much?"     Upper left swollen shut in [place] 4. VISION: "Is there any difficulty seeing clearly?"      Spot in vision field 5. PAIN: "Is there any pain? If so, ask: "How bad is it?" (Scale 1-10; or mild, moderate, severe)    - MILD (1-3): doesn't interfere with normal activities     - MODERATE (4-7): interferes with normal activities or awakens from sleep    - SEVERE (8-10): excruciating pain, unable to do any normal activities       Aching to sharp about 7 6. CONTACT LENS: "Do you wear contacts?"     no 7. OTHER SYMPTOMS: "Do you have any other symptoms?" (e.g., fever, runny nose, cough)     no 8. PREGNANCY: "Is there any chance you are pregnant?" "When was your last menstrual period?"     N/A  Protocols used: EYE - PUS OR DISCHARGE-A-AH

## 2018-08-31 ENCOUNTER — Other Ambulatory Visit: Payer: Self-pay

## 2018-08-31 DIAGNOSIS — Z20822 Contact with and (suspected) exposure to covid-19: Secondary | ICD-10-CM

## 2018-09-01 LAB — NOVEL CORONAVIRUS, NAA: SARS-CoV-2, NAA: NOT DETECTED

## 2018-09-01 LAB — SPECIMEN STATUS REPORT

## 2018-10-18 ENCOUNTER — Other Ambulatory Visit: Payer: Self-pay

## 2018-10-18 ENCOUNTER — Ambulatory Visit: Payer: Commercial Managed Care - PPO | Admitting: Emergency Medicine

## 2018-10-18 ENCOUNTER — Encounter: Payer: Self-pay | Admitting: Emergency Medicine

## 2018-10-18 ENCOUNTER — Ambulatory Visit (INDEPENDENT_AMBULATORY_CARE_PROVIDER_SITE_OTHER): Payer: Commercial Managed Care - PPO

## 2018-10-18 VITALS — BP 142/92 | HR 105 | Temp 98.0°F | Ht 69.0 in | Wt 241.0 lb

## 2018-10-18 DIAGNOSIS — S63681A Other sprain of right thumb, initial encounter: Secondary | ICD-10-CM | POA: Diagnosis not present

## 2018-10-18 DIAGNOSIS — M79644 Pain in right finger(s): Secondary | ICD-10-CM | POA: Diagnosis not present

## 2018-10-18 NOTE — Patient Instructions (Addendum)
If you have lab work done today you will be contacted with your lab results within the next 2 weeks.  If you have not heard from [place] then please contact us. The fastest way to get your results is to register for My Chart.   IF you received an x-ray today, you will receive an invoice from [facility]. Please contact [facility] at [phone] with questions or concerns regarding your invoice.   IF you received labwork today, you will receive an invoice from [place]. Please contact [facility] at [phone] with questions or concerns regarding your invoice.   Our billing staff will not be able to assist you with questions regarding bills from these companies.  You will be contacted with the lab results as soon as they are available. The fastest way to get your results is to activate your My Chart account. Instructions are located on the last page of this paperwork. If you have not heard from [place] regarding the results in 2 weeks, please contact this office.     Thumb Sprain  A thumb sprain is an injury to one of the bands of tissue (ligaments) that connect the bones in your thumb. The ligament may be stretched too much, or it may be torn. A tear can be either partial or complete. How bad, or severe, the sprain is depends on how much of the ligament was damaged or torn. What are the causes? A thumb sprain is often caused by a fall or an accident, such as when you hold your hands out to catch something or to protect yourself. What increases the risk? This injury is more likely to occur in people who play sports that involve:  A risk of falling, such as skiing.  Catching an object, such as basketball. What are the signs or symptoms? Symptoms of this condition include:  Not being able to move the thumb normally.  Swelling.  Tenderness.  Bruising. How is this diagnosed? This condition may be diagnosed based on:  Your symptoms and medical history. Your health care  provider may ask about any recent injuries to your thumb.  A physical exam.  Imaging studies such as X-ray, ultrasound, or MRI. How is this treated? Treatment for this condition depends on how severe your sprain is.  If your ligament is overstretched or partially torn, treatment usually involves keeping your thumb in a fixed position (immobilization) for at least 4 to 6 weeks. Your health care provider will apply a bandage, cast, or splint to keep your thumb from moving until it heals.  If your ligament is fully torn, you may need surgery to reconnect the ligament to the bone. After surgery, you will need to wear a cast or splint on your thumb. Your health care provider may also recommend physical therapy to strengthen your thumb. Follow these instructions at home: If you have a splint or bandage:  Wear the splint or bandage as told by your health care provider. Remove it only as told by your health care provider.  Loosen the splint or bandage if your thumb or fingers tingle, become numb, or turn cold and blue.  Keep the splint or bandage clean and dry. If you have a cast:  Do not stick anything inside the cast to scratch your skin. Doing that increases your risk of infection.  Check the skin around the cast every day. Tell your health care provider about any concerns.  You may put lotion on dry skin around the edges  of the cast. Do not put lotion on the skin underneath the cast.  Keep the cast clean and dry. Bathing  Do not take baths, swim, or use a hot tub until your health care provider approves. Ask your health care provider if you may take showers. You may only be allowed to take sponge baths.  If your splint, bandage, or cast is not waterproof: ? Do not let it get wet. ? Cover it with a watertight covering to protect it from water when you take a bath or shower. Managing pain, stiffness, and swelling   If directed, put ice on your thumb: ? If you have a removable  splint, remove it as told by your health care provider. ? Put ice in a plastic bag. ? Place a towel between your skin and the bag, or between your cast and the bag. ? Leave the ice on for 20 minutes, 2-3 times a day.  Move your fingers often to avoid stiffness and to lessen swelling.  Raise (elevate) your hand above the level of your heart while you are sitting or lying down. Activity  Return to your normal activities as told by your health care provider. Ask your health care provider what activities are safe for you.  Do physical therapy exercises as directed. After your splint or cast is removed, your health care provider may recommend that you: ? Move your thumb in circles. ? Touch your thumb to your pinky finger. ? Do these exercises several times a day.  Ask your health care provider if you may use a hand exerciser to strengthen your muscles.  If your thumb feels stiff while you are exercising it, try doing the exercises while soaking your hand in warm water. Driving  Do not drive until your health care provider approves.  Donot drive or use heavy machinery while taking prescription pain medicine. General instructions  Do not put pressure on any part of the cast or splint until it is fully hardened, if applicable. This may take several hours.  Take over-the-counter and prescription medicines only as told by your health care provider.  Do not use any products that contain nicotine or tobacco, such as cigarettes and e-cigarettes. These can delay healing. If you need help quitting, ask your health care provider.  Do not wear rings on your injured thumb.  Keep all follow-up visits as told by your health care provider. This is important. Contact a health care provider if you have:  Pain that gets worse or does not get better with medicine.  Bruising or swelling that gets worse.  Your cast or splint is damaged. Get help right away if:  Your thumb feels numb, tingles, turns  cold, or turns blue, even after loosening your splint or bandage (if applicable). Summary  A thumb sprain is an injury to one of the bands of tissue (ligaments) that connect the bones in your thumb.  Thumb sprains are more likely to occur in people who play sports that involve a risk of falling or having to catch an object.  Treatment will depend on how severe the sprain is, but it will require keeping the thumb in a fixed position. It might require surgery.  Make sure you understand and follow all of your health care provider's instructions for home care. This information is not intended to replace advice given to you by your health care provider. Make sure you discuss any questions you have with your health care provider. Document Released: 02/06/2004 Document Revised:  01/21/2017 Document Reviewed: 01/21/2017 Elsevier Patient Education  2020 [employer].

## 2018-10-18 NOTE — Progress Notes (Signed)
Melene MullerRyan Amble 33 y.o.   Chief Complaint  Patient presents with  . Hand Injury    per patient he thinks Right thumb is broken since Friday nigh    HISTORY OF PRESENT ILLNESS: This is a 33 y.o. male injured right thumb 4 days ago, thinks it might be broken.  HPI   Prior to Admission medications   Not on File    No Known Allergies  There are no active problems to display for this patient.   History reviewed. No pertinent past medical history.  Past Surgical History:  Procedure Laterality Date  . ANKLE SURGERY    . WRIST SURGERY     Nerve Repair, right wrist    Social History   Socioeconomic History  . Marital status: Single    Spouse name: Not on file  . Number of children: Not on file  . Years of education: Not on file  . Highest education level: Not on file  Occupational History  . Not on file  Social Needs  . Financial resource strain: Not on file  . Food insecurity    Worry: Not on file    Inability: Not on file  . Transportation needs    Medical: Not on file    Non-medical: Not on file  Tobacco Use  . Smoking status: Current Every Day Smoker  . Smokeless tobacco: Never Used  Substance and Sexual Activity  . Alcohol use: Yes    Comment: weekends  . Drug use: No  . Sexual activity: Not on file  Lifestyle  . Physical activity    Days per week: Not on file    Minutes per session: Not on file  . Stress: Not on file  Relationships  . Social Musicianconnections    Talks on phone: Not on file    Gets together: Not on file    Attends religious service: Not on file    Active member of club or organization: Not on file    Attends meetings of clubs or organizations: Not on file    Relationship status: Not on file  . Intimate partner violence    Fear of current or ex partner: Not on file    Emotionally abused: Not on file    Physically abused: Not on file    Forced sexual activity: Not on file  Other Topics Concern  . Not on file  Social History Narrative   . Not on file    Family History  Adopted: Yes  Problem Relation Age of Onset  . Mental illness Father      Review of Systems  Constitutional: Negative.  Negative for chills and fever.  HENT: Negative.  Negative for congestion and sore throat.   Respiratory: Negative.  Negative for cough and shortness of breath.   Cardiovascular: Negative.  Negative for chest pain and palpitations.  Gastrointestinal: Negative.  Negative for abdominal pain, diarrhea, nausea and vomiting.  Musculoskeletal: Negative.  Negative for myalgias.  Neurological: Negative.  Negative for dizziness and headaches.  All other systems reviewed and are negative.  Vitals:   10/18/18 1130  BP: (!) 142/92  Pulse: (!) 105  Temp: 98 F (36.7 C)     Physical Exam Constitutional:      Appearance: Normal appearance.  HENT:     Head: Normocephalic.  Eyes:     Extraocular Movements: Extraocular movements intact.     Pupils: Pupils are equal, round, and reactive to light.  Neck:     Musculoskeletal: Normal range  of motion.  Cardiovascular:     Rate and Rhythm: Normal rate.  Pulmonary:     Effort: Pulmonary effort is normal.  Musculoskeletal:     Comments: Right thumb: Some swelling and tenderness proximally with limited range of motion due to pain.  Neurovascularly intact.  Skin:    General: Skin is warm and dry.  Neurological:     General: No focal deficit present.     Mental Status: He is alert and oriented to person, place, and time.  Psychiatric:        Mood and Affect: Mood normal.        Behavior: Behavior normal.    Dg Finger Thumb Right  Result Date: 10/18/2018 CLINICAL DATA:  Right thumb pain.  No known injury. EXAM: RIGHT THUMB 2+V COMPARISON:  None. FINDINGS: There is no evidence of fracture or dislocation. There is no evidence of arthropathy or other focal bone abnormality. Soft tissues are unremarkable. IMPRESSION: Normal exam. Electronically Signed   By: Drusilla Kanner M.D.   On:  10/18/2018 12:00     ASSESSMENT & PLAN: Merdith was seen today for hand injury.  Diagnoses and all orders for this visit:  Other sprain of right thumb, initial encounter  Pain of right thumb -     DG Finger Thumb Right; Future    Patient Instructions       If you have lab work done today you will be contacted with your lab results within the next 2 weeks.  If you have not heard from [place] then please contact us. The fastest way to get your results is to register for My Chart.   IF you received an x-ray today, you will receive an invoice from [facility]. Please contact [facility] at [phone] with questions or concerns regarding your invoice.   IF you received labwork today, you will receive an invoice from [facility]. Please contact [facility] at [phone] with questions or concerns regarding your invoice.   Our billing staff will not be able to assist you with questions regarding bills from these companies.  You will be contacted with the lab results as soon as they are available. The fastest way to get your results is to activate your My Chart account. Instructions are located on the last page of this paperwork. If you have not heard from [place] regarding the results in 2 weeks, please contact this office.     Thumb Sprain  A thumb sprain is an injury to one of the bands of tissue (ligaments) that connect the bones in your thumb. The ligament may be stretched too much, or it may be torn. A tear can be either partial or complete. How bad, or severe, the sprain is depends on how much of the ligament was damaged or torn. What are the causes? A thumb sprain is often caused by a fall or an accident, such as when you hold your hands out to catch something or to protect yourself. What increases the risk? This injury is more likely to occur in people who play sports that involve:  A risk of falling, such as skiing.  Catching an object, such as basketball. What are  the signs or symptoms? Symptoms of this condition include:  Not being able to move the thumb normally.  Swelling.  Tenderness.  Bruising. How is this diagnosed? This condition may be diagnosed based on:  Your symptoms and medical history. Your health care provider may ask about any recent injuries to your thumb.  A physical exam.  Imaging studies such as X-ray, ultrasound, or MRI. How is this treated? Treatment for this condition depends on how severe your sprain is.  If your ligament is overstretched or partially torn, treatment usually involves keeping your thumb in a fixed position (immobilization) for at least 4 to 6 weeks. Your health care provider will apply a bandage, cast, or splint to keep your thumb from moving until it heals.  If your ligament is fully torn, you may need surgery to reconnect the ligament to the bone. After surgery, you will need to wear a cast or splint on your thumb. Your health care provider may also recommend physical therapy to strengthen your thumb. Follow these instructions at home: If you have a splint or bandage:  Wear the splint or bandage as told by your health care provider. Remove it only as told by your health care provider.  Loosen the splint or bandage if your thumb or fingers tingle, become numb, or turn cold and blue.  Keep the splint or bandage clean and dry. If you have a cast:  Do not stick anything inside the cast to scratch your skin. Doing that increases your risk of infection.  Check the skin around the cast every day. Tell your health care provider about any concerns.  You may put lotion on dry skin around the edges of the cast. Do not put lotion on the skin underneath the cast.  Keep the cast clean and dry. Bathing  Do not take baths, swim, or use a hot tub until your health care provider approves. Ask your health care provider if you may take showers. You may only be allowed to take sponge baths.  If your splint,  bandage, or cast is not waterproof: ? Do not let it get wet. ? Cover it with a watertight covering to protect it from water when you take a bath or shower. Managing pain, stiffness, and swelling   If directed, put ice on your thumb: ? If you have a removable splint, remove it as told by your health care provider. ? Put ice in a plastic bag. ? Place a towel between your skin and the bag, or between your cast and the bag. ? Leave the ice on for 20 minutes, 2-3 times a day.  Move your fingers often to avoid stiffness and to lessen swelling.  Raise (elevate) your hand above the level of your heart while you are sitting or lying down. Activity  Return to your normal activities as told by your health care provider. Ask your health care provider what activities are safe for you.  Do physical therapy exercises as directed. After your splint or cast is removed, your health care provider may recommend that you: ? Move your thumb in circles. ? Touch your thumb to your pinky finger. ? Do these exercises several times a day.  Ask your health care provider if you may use a hand exerciser to strengthen your muscles.  If your thumb feels stiff while you are exercising it, try doing the exercises while soaking your hand in warm water. Driving  Do not drive until your health care provider approves.  Donot drive or use heavy machinery while taking prescription pain medicine. General instructions  Do not put pressure on any part of the cast or splint until it is fully hardened, if applicable. This may take several hours.  Take over-the-counter and prescription medicines only as told by your health care provider.  Do not use  any products that contain nicotine or tobacco, such as cigarettes and e-cigarettes. These can delay healing. If you need help quitting, ask your health care provider.  Do not wear rings on your injured thumb.  Keep all follow-up visits as told by your health care provider.  This is important. Contact a health care provider if you have:  Pain that gets worse or does not get better with medicine.  Bruising or swelling that gets worse.  Your cast or splint is damaged. Get help right away if:  Your thumb feels numb, tingles, turns cold, or turns blue, even after loosening your splint or bandage (if applicable). Summary  A thumb sprain is an injury to one of the bands of tissue (ligaments) that connect the bones in your thumb.  Thumb sprains are more likely to occur in people who play sports that involve a risk of falling or having to catch an object.  Treatment will depend on how severe the sprain is, but it will require keeping the thumb in a fixed position. It might require surgery.  Make sure you understand and follow all of your health care provider's instructions for home care. This information is not intended to replace advice given to you by your health care provider. Make sure you discuss any questions you have with your health care provider. Document Released: 02/06/2004 Document Revised: 01/21/2017 Document Reviewed: 01/21/2017 Elsevier Patient Education  2020 Elsevier Inc.      Agustina Caroli, MD [facility]

## 2020-05-23 IMAGING — DX DG FINGER THUMB 2+V*R*
3 series · 3 of 3 positions shown · non-contrast
Comparison: None.

CLINICAL DATA: Right thumb pain.  No known injury.

EXAM:
RIGHT THUMB 2+V

[finger ap]
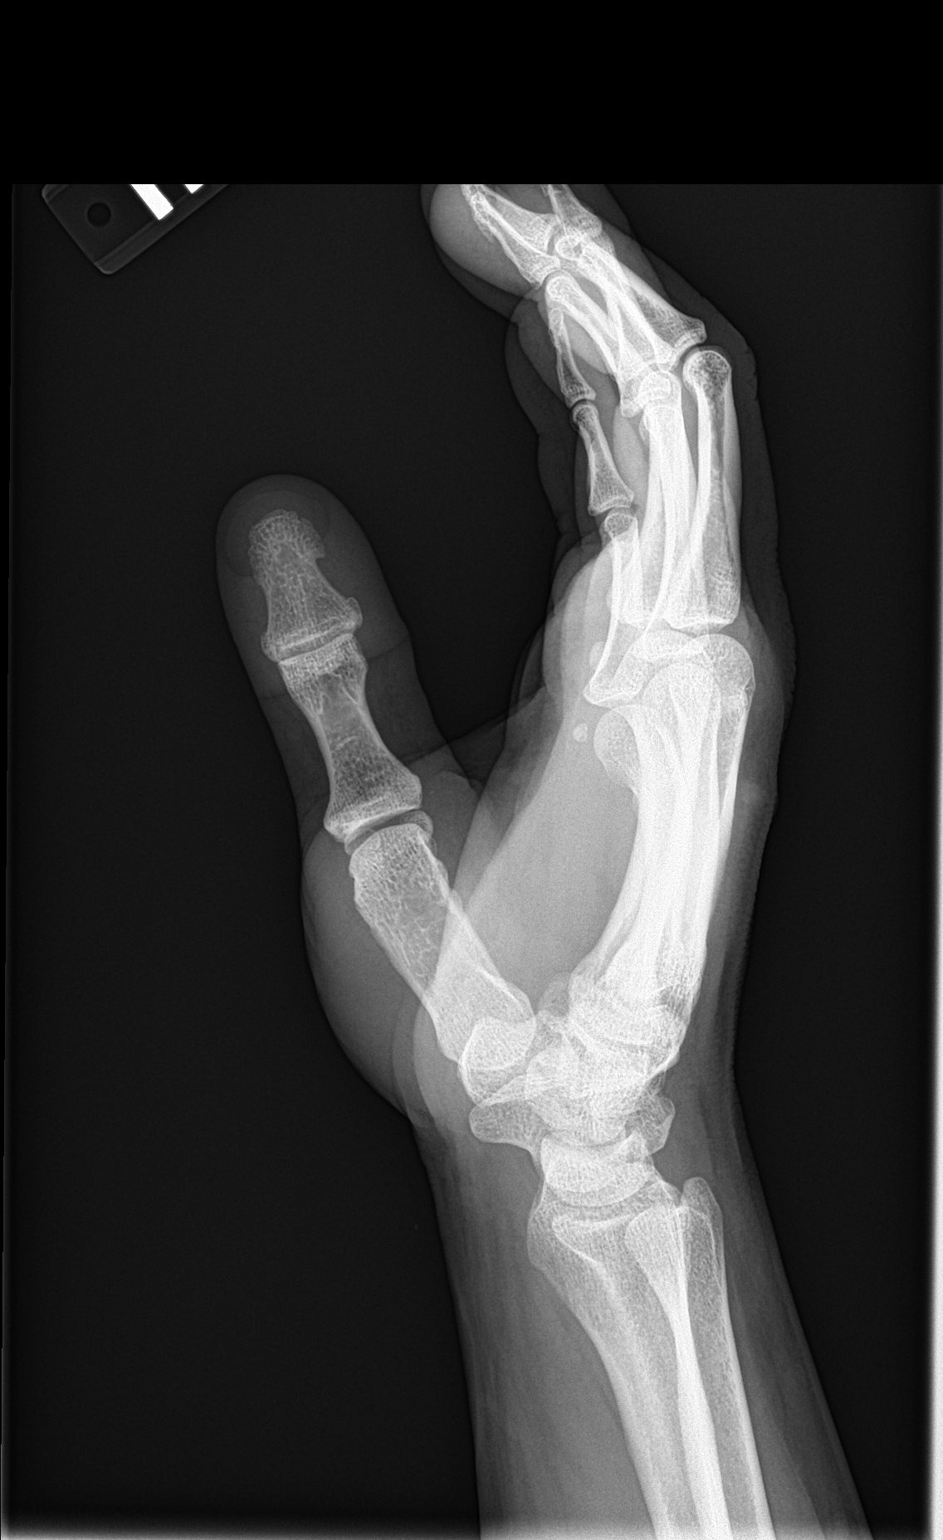

[finger obl]
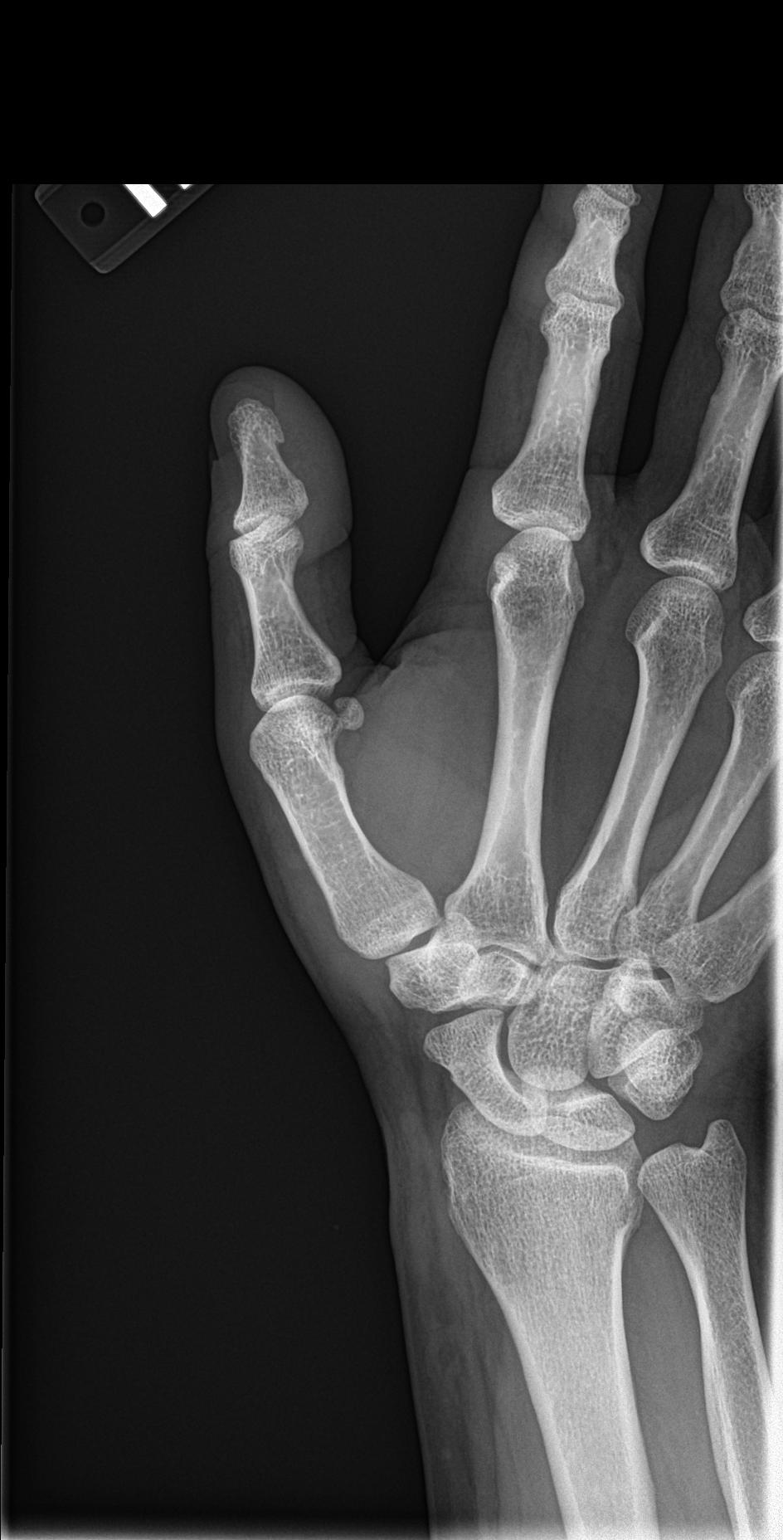

[finger lat]
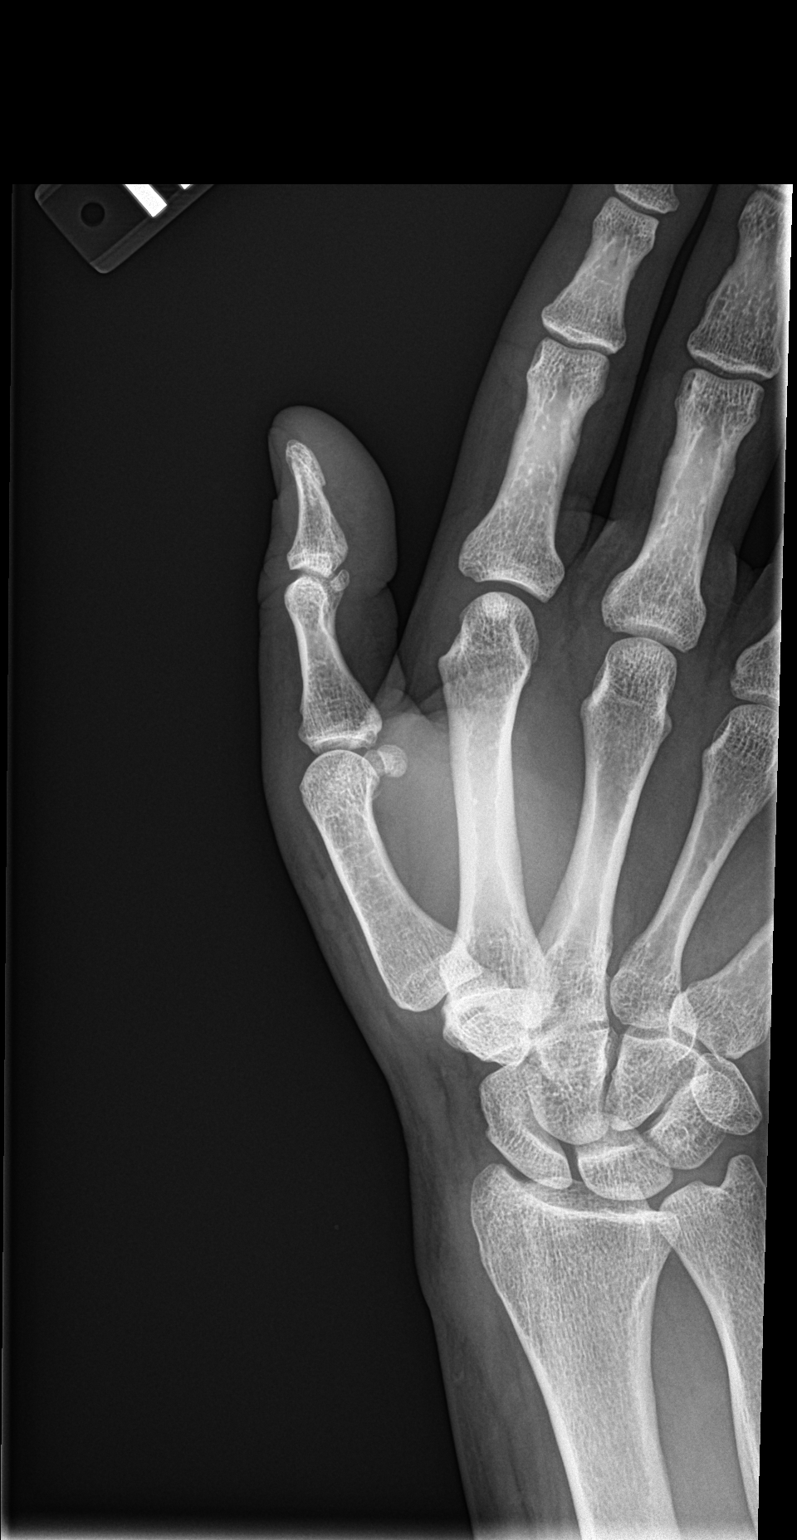

[3 of 3 positions shown; findings below may reference images not displayed]

FINDINGS: There is no evidence of fracture or dislocation. There is no
evidence of arthropathy or other focal bone abnormality. Soft
tissues are unremarkable.
IMPRESSION: Normal exam.

## 2022-09-02 ENCOUNTER — Ambulatory Visit (HOSPITAL_COMMUNITY)
Admission: EM | Admit: 2022-09-02 | Discharge: 2022-09-02 | Disposition: A | Discharge status: Home / Self Care | Payer: Commercial Managed Care - PPO | Attending: Emergency Medicine | Admitting: Emergency Medicine

## 2022-09-02 ENCOUNTER — Encounter (HOSPITAL_COMMUNITY): Payer: Self-pay

## 2022-09-02 DIAGNOSIS — I10 Essential (primary) hypertension: Secondary | ICD-10-CM | POA: Diagnosis not present

## 2022-09-02 DIAGNOSIS — R519 Headache, unspecified: Secondary | ICD-10-CM

## 2022-09-02 DIAGNOSIS — B029 Zoster without complications: Secondary | ICD-10-CM

## 2022-09-02 MED ORDER — MUPIROCIN 2 % EX OINT: 1.0000 | TOPICAL_OINTMENT | Freq: Two times a day (BID) | CUTANEOUS | 0 refills | Status: DC

## 2022-09-02 MED ORDER — ACETAMINOPHEN 325 MG PO TABS
ORAL_TABLET | ORAL | Status: AC
  Filled 2022-09-02: qty 3

## 2022-09-02 MED ORDER — TETRACAINE HCL 0.5 % OP SOLN
OPHTHALMIC | Status: AC
  Filled 2022-09-02: qty 4

## 2022-09-02 MED ORDER — KETOROLAC TROMETHAMINE 30 MG/ML IJ SOLN
30.0000 mg | Freq: Once | INTRAMUSCULAR | Status: AC
  Administered 2022-09-02: 30 mg via INTRAMUSCULAR

## 2022-09-02 MED ORDER — BLOOD PRESSURE CUFF MISC: 0 refills | Status: AC

## 2022-09-02 MED ORDER — FLUORESCEIN SODIUM 1 MG OP STRP
ORAL_STRIP | OPHTHALMIC | Status: AC
  Filled 2022-09-02: qty 1

## 2022-09-02 MED ORDER — IBUPROFEN 600 MG PO TABS: 600.0000 mg | ORAL_TABLET | Freq: Four times a day (QID) | ORAL | 0 refills | Status: AC | PRN

## 2022-09-02 MED ORDER — VALACYCLOVIR HCL 1 G PO TABS: 1000.0000 mg | ORAL_TABLET | Freq: Three times a day (TID) | ORAL | 0 refills | Status: AC

## 2022-09-02 MED ORDER — ACETAMINOPHEN 325 MG PO TABS
975.0000 mg | ORAL_TABLET | Freq: Once | ORAL | Status: AC
  Administered 2022-09-02: 975 mg via ORAL

## 2022-09-02 MED ORDER — AMLODIPINE BESYLATE 5 MG PO TABS: 5.0000 mg | ORAL_TABLET | Freq: Every day | ORAL | 0 refills | Status: AC

## 2022-09-02 MED ORDER — KETOROLAC TROMETHAMINE 30 MG/ML IJ SOLN
INTRAMUSCULAR | Status: AC
  Filled 2022-09-02: qty 1

## 2022-09-02 MED ORDER — HYDROCODONE-ACETAMINOPHEN 5-325 MG PO TABS
1.0000 | ORAL_TABLET | Freq: Four times a day (QID) | ORAL | 0 refills | Status: DC | PRN
Start: 2022-09-02 — End: 2023-12-02

## 2022-09-02 NOTE — Discharge Instructions (Addendum)
Start the Valtrex as soon as you can.  This is an antiviral and will help stop the shingles.  Take ibuprofen with a Tylenol containing product 3 times a day.  Either take the ibuprofen with 1000 mg of plain Tylenol for mild to moderate pain, or 1-2 Norco for severe pain.  Do not take more than 4000 mg of Tylenol from all sources in 1 day.  Do not take the Norco and Tylenol, as they both have Tylenol in them and too much Tylenol can hurt your liver.  Bactroban on the scabbed lesions.  Amlodipine for your blood pressure.  If you fill only 2 medications, I would fill the Valtrex and the amlodipine.  Do this is soon as you can.  Call Dr. Claudie Leach office and arrange an appointment for tomorrow or Friday.  Tell them that you were seen by the urgent care, and that the provider spoke with Dr. Nada Libman, and instructed you to come in for evaluation tomorrow or Friday.  Decrease your salt intake. diet and exercise will lower your blood pressure significantly. It is important to keep your blood pressure under good control, as having a elevated blood pressure for prolonged periods of time significantly increases your risk of stroke, heart attacks, kidney damage, eye damage, and other problems. Get a validated blood pressure cuff that goes on your arm, not your wrist.  Measure your blood pressure once a day, preferably at the same time every day. Keep a log of this and bring it to your next doctor's appointment.  Bring your blood pressure cuff as well.  Return here in 2 weeks for blood pressure recheck if you're unable to find a primary care physician by then. Return immediately to the ER if you start having chest pain, headache, problems seeing, problems talking, problems walking, if you feel like you're about to pass out, if you do pass out, if you have a seizure, or for any other concerns.  Go to www.goodrx.com  or www.costplusdrugs.com to look up your medications. This will give you a list of where you can find  your prescriptions at the most affordable prices. Or ask the pharmacist what the cash price is, or if they have any other discount programs available to help make your medication more affordable. This can be less expensive than what you would pay with insurance.

## 2022-09-02 NOTE — ED Triage Notes (Signed)
Pt presents to UC w/ c/o dull pain and swelling on left side of neck x1 week. Pt has a rash on left side of face and forehead.  Took ibuprofen for pain relief this morning.

## 2022-09-02 NOTE — ED Provider Notes (Signed)
HPI  SUBJECTIVE:  Dwayne Mason is a 37 y.o. male who presents with 1 week of a painful mass under his left jaw and left neck, a left-sided headache described as sharp, located behind his left eye lasting a second and then resolving.  He states this headache occurs all day long.  He states that the headache is getting worse, and now he has irritated, burning left eye pain.  He noticed a stinging, burning, painful rash starting 5 days ago on his forehead.  It has spread to his scalp, neck and is now next to his medial left eye.  No blurry vision, double vision, visual loss, pain with extraocular movements.  No photophobia, nausea and vomiting, fevers, numbness, tingling, weakness in his arm, leg or face.  No slurred speech, chest pain, shortness of breath, palpitations, tearing pain through to his back, abdominal pain, hematuria, seizure, syncope, anuria, lower extremity edema.  He has been taking 600 mg of ibuprofen every morning with improvement in his pain.  No aggravating factors.  He has a past medical history of migraines and varicella.  He has no formal diagnosis of hypertension, and has never been on any medications for it.  He states that he does not like going to the doctor, and that it makes him nervous.  He has not seen a primary care provider in years.  Ophthalmology: None.    History reviewed. No pertinent past medical history.  Past Surgical History:  Procedure Laterality Date   ANKLE SURGERY     WRIST SURGERY     Nerve Repair, right wrist    Family History  Adopted: Yes  Problem Relation Age of Onset   Mental illness Father     Social History   Tobacco Use   Smoking status: Every Day    Types: Cigarettes   Smokeless tobacco: Never  Substance Use Topics   Alcohol use: Yes    Comment: weekends   Drug use: No    No current facility-administered medications for this encounter.  Current Outpatient Medications:    amLODipine (NORVASC) 5 MG tablet, Take 1 tablet (5 mg  total) by mouth daily., Disp: 30 tablet, Rfl: 0   Blood Pressure Monitoring (BLOOD PRESSURE CUFF) MISC, Measure blood pressure once a day, Disp: 1 each, Rfl: 0   HYDROcodone-acetaminophen (NORCO/VICODIN) 5-325 MG tablet, Take 1-2 tablets by mouth every 6 (six) hours as needed for moderate pain., Disp: 12 tablet, Rfl: 0   ibuprofen (ADVIL) 600 MG tablet, Take 1 tablet (600 mg total) by mouth every 6 (six) hours as needed., Disp: 20 tablet, Rfl: 0   mupirocin ointment (BACTROBAN) 2 %, Apply 1 Application topically 2 (two) times daily., Disp: 22 g, Rfl: 0   valACYclovir (VALTREX) 1000 MG tablet, Take 1 tablet (1,000 mg total) by mouth 3 (three) times daily for 7 days., Disp: 21 tablet, Rfl: 0  No Known Allergies   ROS  As noted in HPI.   Physical Exam  BP (!) 190/120 (BP Location: Right Arm)   Pulse 90   Temp 98.9 F (37.2 C) (Oral)   Resp 16   SpO2 96%   BP Readings from Last 3 Encounters:  09/02/22 (!) 190/120  10/18/18 (!) 142/92  10/25/17 (!) 146/84    Constitutional: Well developed, well nourished, appears very anxious  Eyes:  EOMI, conjunctiva normal bilaterally.  PERRLA.  No direct or consensual photophobia.  Positive left conjunctival injection.  No dendrites seen on flourescin exam.   Visual Acuity  Right  Eye Distance: 20/20 Left Eye Distance: 20/20 Bilateral Distance: 20/20  Right Eye Near:   Left Eye Near:    Bilateral Near:      HENT: Normocephalic, atraumatic,mucus membranes moist.  Left EAC, TM normal. Respiratory: Normal inspiratory effort, lungs clear bilaterally Cardiovascular: Normal rate, regular rhythm, no murmurs rubs or gallops GI: nondistended skin: Tender erythematous rash of the medial corner of his left eye     Tender raised lesion on medial left nasal bridge.     Tender raised lesion on left forehead and scalp    Tender raised vesicular rash on left forehead     Musculoskeletal: no deformities Neurologic: Alert & oriented x 3, no  focal neuro deficits.  Cranial nerves III through XII intact.  Finger-nose, heel shin within normal limits.  Tandem gait steady.  Romberg negative.  Speech fluent.  GCS 15. Psychiatric: Speech and behavior appropriate   ED Course   Medications  ketorolac (TORADOL) 30 MG/ML injection 30 mg (30 mg Intramuscular Given 09/02/22 1721)  acetaminophen (TYLENOL) tablet 975 mg (975 mg Oral Given 09/02/22 1721)    Orders Placed This Encounter  Procedures   Visual acuity screening    Standing Status:   Standing    Number of Occurrences:   1   Recheck vitals    Standing Status:   Standing    Number of Occurrences:   1    No results found for this or any previous visit (from the past 24 hour(s)). No results found.  ED Clinical Impression  1. Herpes zoster without complication   2. Essential hypertension   3. Acute nonintractable headache, unspecified headache type      ED Assessment/Plan    Patient notably hypertensive in triage.  Could be from anxiety, pain, or this could be his baseline.  Will try treating the pain and seeing if his blood pressure changes.  Toradol 30 mg IM and Tylenol 975 mg p.o.  On reevaluation, patient states his headache feels a little bit better.  I would expect that if this was a hypertensive emergency, his headache would not have gotten better with simple analgesics.   His blood pressure has not changed on recheck  However, patient is otherwise symptomatic.  I suspect the headache is from the shingles.  I offered him transfer to the emergency department to rule out hypertensive emergency, but the patient states that he would like to try outpatient treatment first.  I feel that this is reasonable..    His visual acuity is normal and equal bilaterally.  I am unable to appreciate any dendrites on Woods lamp exam, His left eye is injected and irritated.   Discussed case with Dr. Nada Libman, ophthalmology on-call.  Because his visual acuity is normal, he has no  dendrites on exam, he recommends treating with Valtrex and having him follow-up with him in the office tomorrow or Friday.  [facility] narcotic database reviewed.  No opiate prescriptions in the past 2 years.  1.  Shingles.  Valtrex.  Ibuprofen/Tylenol containing product 3 times a day.  1000 mg of plain Tylenol for mild to moderate pain.  1-2 Norco for severe pain.  Bactroban.  Follow-up with ophthalmology tomorrow or Friday.  2.  Elevated blood-pressure reading without diagnosis of hypertension.  Patient appears extremely anxious. We do not know what his baseline is because he does not measure it at home and he carries no formal diagnosis of hypertension.  However his last 3 blood pressures have been elevated since  2019.  Will start him on amlodipine 5 mg daily, prescribe a blood pressure cuff and have him keep a log of his blood pressures.  We will set him up with a primary care provider prior to discharge, or he can return here in 2 weeks with his blood pressure cuff and log and we can adjust his medication if necessary.  3.  Headache.  Suspect from shingles.  Plan as above.  Discussed , MDM, treatment plan, and plan for follow-up with patient. Discussed sn/sx that should prompt return to the ED. patient agrees with plan.   Meds ordered this encounter  Medications   ketorolac (TORADOL) 30 MG/ML injection 30 mg   acetaminophen (TYLENOL) tablet 975 mg   HYDROcodone-acetaminophen (NORCO/VICODIN) 5-325 MG tablet    Sig: Take 1-2 tablets by mouth every 6 (six) hours as needed for moderate pain.    Dispense:  12 tablet    Refill:  0   ibuprofen (ADVIL) 600 MG tablet    Sig: Take 1 tablet (600 mg total) by mouth every 6 (six) hours as needed.    Dispense:  20 tablet    Refill:  0   valACYclovir (VALTREX) 1000 MG tablet    Sig: Take 1 tablet (1,000 mg total) by mouth 3 (three) times daily for 7 days.    Dispense:  21 tablet    Refill:  0   mupirocin ointment (BACTROBAN) 2 %    Sig: Apply  1 Application topically 2 (two) times daily.    Dispense:  22 g    Refill:  0   amLODipine (NORVASC) 5 MG tablet    Sig: Take 1 tablet (5 mg total) by mouth daily.    Dispense:  30 tablet    Refill:  0   Blood Pressure Monitoring (BLOOD PRESSURE CUFF) MISC    Sig: Measure blood pressure once a day    Dispense:  1 each    Refill:  0      *This clinic note was created using Scientist, clinical (histocompatibility and immunogenetics). Therefore, there may be occasional mistakes despite careful proofreading.  ?    Domenick Gong, MD 09/03/22 [phone]

## 2022-10-20 ENCOUNTER — Ambulatory Visit: Payer: Commercial Managed Care - PPO | Admitting: Family Medicine

## 2023-11-08 ENCOUNTER — Other Ambulatory Visit: Payer: Self-pay

## 2023-11-08 ENCOUNTER — Encounter (HOSPITAL_COMMUNITY): Payer: Self-pay | Admitting: *Deleted

## 2023-11-08 ENCOUNTER — Ambulatory Visit (HOSPITAL_COMMUNITY): Admission: EM | Admit: 2023-11-08 | Discharge: 2023-11-08 | Disposition: A | Discharge status: Home / Self Care

## 2023-11-08 DIAGNOSIS — S91104A Unspecified open wound of right lesser toe(s) without damage to nail, initial encounter: Secondary | ICD-10-CM

## 2023-11-08 DIAGNOSIS — S91114A Laceration without foreign body of right lesser toe(s) without damage to nail, initial encounter: Secondary | ICD-10-CM

## 2023-11-08 DIAGNOSIS — Z23 Encounter for immunization: Secondary | ICD-10-CM | POA: Diagnosis not present

## 2023-11-08 MED ORDER — TETANUS-DIPHTH-ACELL PERTUSSIS 5-2-15.5 LF-MCG/0.5 IM SUSP
INTRAMUSCULAR | Status: AC
  Filled 2023-11-08: qty 0.5

## 2023-11-08 MED ORDER — TETANUS-DIPHTH-ACELL PERTUSSIS 5-2-15.5 LF-MCG/0.5 IM SUSP
0.5000 mL | Freq: Once | INTRAMUSCULAR | Status: AC
  Administered 2023-11-08: 0.5 mL via INTRAMUSCULAR

## 2023-11-08 NOTE — ED Triage Notes (Signed)
 PT reports hitting his Rt 2nd toe last night on a metal floor lamp. Pt presents with dsy to toe .

## 2023-11-08 NOTE — ED Provider Notes (Signed)
 [facility]    CSN: [id number] Arrival date & time: 11/08/23  9178      History   Chief Complaint Chief Complaint  Patient presents with   Laceration    HPI Dwayne Mason is a 38 y.o. male.   Pt presents today due to laceration of second toe of right foot that happened last night around 7-8pm. Pt states that he was walking with a plate of food in the dark and went to use his foot to tap the metal base of lamp to turn it Mason, miss stepped and cut his toe. Bleeding appears to be well controlled, pt does not know the date of his last tetanus immunization.   The history is provided by the patient.  Laceration   History reviewed. No pertinent past medical history.  There are no active problems to display for this patient.   Past Surgical History:  Procedure Laterality Date   ANKLE SURGERY     WRIST SURGERY     Nerve Repair, right wrist       Home Medications    Prior to Admission medications   Medication Sig Start Date End Date Taking? Authorizing Provider  amLODipine  (NORVASC ) 5 MG tablet Take 1 tablet (5 mg total) by mouth daily. 09/02/22   Van Knee, MD  Blood Pressure Monitoring (BLOOD PRESSURE CUFF) MISC Measure blood pressure once a day 09/02/22   Van Knee, MD  HYDROcodone -acetaminophen  (NORCO/VICODIN) 5-325 MG tablet Take 1-2 tablets by mouth every 6 (six) hours as needed for moderate pain. 09/02/22   Van Knee, MD  ibuprofen  (ADVIL ) 600 MG tablet Take 1 tablet (600 mg total) by mouth every 6 (six) hours as needed. 09/02/22   Van Knee, MD  mupirocin  ointment (BACTROBAN ) 2 % Apply 1 Application topically 2 (two) times daily. 09/02/22   Van Knee, MD    Family History Family History  Adopted: Yes  Problem Relation Age of Onset   Mental illness Father     Social History Social History   Tobacco Use   Smoking status: Every Day    Types: Cigarettes   Smokeless tobacco: Never  Substance Use Topics   Alcohol  use: Yes    Comment: weekends   Drug use: No     Allergies   Patient has no known allergies.   Review of Systems Review of Systems   Physical Exam Triage Vital Signs ED Triage Vitals  Encounter Vitals Group     BP 11/08/23 0834 (!) 191/106     Girls Systolic BP Percentile --      Girls Diastolic BP Percentile --      Boys Systolic BP Percentile --      Boys Diastolic BP Percentile --      Pulse Rate 11/08/23 0834 87     Resp 11/08/23 0834 18     Temp 11/08/23 0834 98 F (36.7 C)     Temp src --      SpO2 11/08/23 0834 94 %     Weight --      Height --      Head Circumference --      Peak Flow --      Pain Score 11/08/23 0831 6     Pain Loc --      Pain Education --      Exclude from Growth Chart --    No data found.  Updated Vital Signs BP (!) 191/106   Pulse 87   Temp 98 F (36.7  C)   Resp 18   SpO2 94%   Visual Acuity Right Eye Distance:   Left Eye Distance:   Bilateral Distance:    Right Eye Near:   Left Eye Near:    Bilateral Near:     Physical Exam Vitals and nursing note reviewed.  Constitutional:      General: He is not in acute distress.    Appearance: Normal appearance. He is not ill-appearing, toxic-appearing or diaphoretic.  Eyes:     General: No scleral icterus. Cardiovascular:     Rate and Rhythm: Normal rate and regular rhythm.     Heart sounds: Normal heart sounds.  Pulmonary:     Effort: Pulmonary effort is normal. No respiratory distress.     Breath sounds: Normal breath sounds. No wheezing or rhonchi.  Musculoskeletal:       Feet:  Skin:    General: Skin is warm.  Neurological:     Mental Status: He is alert and oriented to person, place, and time.  Psychiatric:        Mood and Affect: Mood normal.        Behavior: Behavior normal.      UC Treatments / Results  Labs (all labs ordered are listed, but only abnormal results are displayed) Labs Reviewed - No data to display  EKG   Radiology No results  found.  Procedures Procedures (including critical care time)  Medications Ordered in UC Medications  Tdap (ADACEL) injection 0.5 mL (has no administration in time range)    Initial Impression / Assessment and Plan / UC Course  I have reviewed the triage vital signs and the nursing notes.  Pertinent labs & imaging results that were available during my care of the patient were reviewed by me and considered in my medical decision making (see chart for details).     Laceration of second toe right foot was cleaned and bandaged.  Wound not closed because of the open for more than 12 hours.  Wound edges are well-approximated and should heal without issue.  Tetanus immunization was updated today. Final Clinical Impressions(s) / UC Diagnoses   Final diagnoses:  Laceration of second toe of right foot, initial encounter   Discharge Instructions   None    ED Prescriptions   None    PDMP not reviewed this encounter.   Andra Corean BROCKS, PA-C 11/08/23 [id number]

## 2023-11-08 NOTE — Discharge Instructions (Signed)
 You have been diagnosed with an abrasion/laceration that requires wound care.  -Use you have the area clean and dry -Change your bandage at least daily, keep bandage clean and dry.  If bandages become soiled or wet they need to be changed soon as possible. -Will get wound daily, if you notice color discharge, foul odor, or pale/gray/black discoloration of skin, increased redness, pain, or swelling you need to seek attention immediately. -You may use ibuprofen and Tylenol as needed for pain control.

## 2023-12-02 ENCOUNTER — Encounter (HOSPITAL_COMMUNITY): Payer: Self-pay | Admitting: *Deleted

## 2023-12-02 ENCOUNTER — Ambulatory Visit (HOSPITAL_COMMUNITY)
Admission: EM | Admit: 2023-12-02 | Discharge: 2023-12-02 | Disposition: A | Discharge status: Home / Self Care | Attending: Family Medicine | Admitting: Family Medicine

## 2023-12-02 ENCOUNTER — Ambulatory Visit (INDEPENDENT_AMBULATORY_CARE_PROVIDER_SITE_OTHER)

## 2023-12-02 DIAGNOSIS — M79642 Pain in left hand: Secondary | ICD-10-CM

## 2023-12-02 DIAGNOSIS — M79645 Pain in left finger(s): Secondary | ICD-10-CM | POA: Diagnosis not present

## 2023-12-02 HISTORY — DX: Essential (primary) hypertension: I10

## 2023-12-02 NOTE — ED Triage Notes (Signed)
 Pt reports falling out of a hot tub 4 days ago; c/o left thumb pain that is persistent. Left thumb distal aspect warm, pink with prompt cap refill.

## 2023-12-02 NOTE — ED Provider Notes (Signed)
 [facility]   [id number] 12/02/23 Arrival Time: 0848  ASSESSMENT & PLAN:  1. Pain of left thumb    I have personally viewed and independently interpreted the imaging studies ordered this visit. L thumb: no acute changes/fx/disclocation appreciated.  Prefers OTC ibuprofen ,  Orders Placed This Encounter  Procedures   DG Finger Thumb Left   Apply Thumb spica   Work/school excuse note: provided. Recommend:  Follow-up Information     Schedule an appointment as soon as possible for a visit  with Romona Harari, MD.   Specialty: Orthopedic Surgery Contact information: [address] [place] [address] [place] [address] [phone]                  Reviewed expectations re: course of current medical issues. Questions answered. Outlined signs and symptoms indicating need for more acute intervention. Patient verbalized understanding. After Visit Summary given.  SUBJECTIVE: History from: patient. Dwayne Mason is a 38 y.o. male who reports L thumb pain; s/p falling out of hot tub 4 d ago; denies open wounds; still sore/painful distally, esp with flexion. Denies extremity sensation changes or weakness. No tx PTA. Is L-handed.   Past Surgical History:  Procedure Laterality Date   ANKLE SURGERY     WRIST SURGERY     Nerve Repair, right wrist     OBJECTIVE:  Vitals:   12/02/23 0906  BP: (!) 180/121  Pulse: 98  Resp: 20  Temp: 98.2 F (36.8 C)  TempSrc: Oral  SpO2: 94%    General appearance: alert; no distress HEENT: [place]; AT Neck: supple with FROM Resp: unlabored respirations Extremities: LUE: warm with well perfused appearance; poorly localized moderate tenderness over left mid to distal volar thumb; without gross deformities; swelling: minimal; bruising: none; thumb ROM: decreased secondary to reported discomfort CV: brisk extremity capillary refill of LUE; 2+ radial pulse of LUE. Skin: warm and dry; no visible rashes Neurologic: gait normal;  normal sensation and strength of LUE Psychological: alert and cooperative; normal mood and affect  Imaging: DG Finger Thumb Left Result Date: 12/02/2023 CLINICAL DATA:  Pain.  Clemens out of hot tub days ago. EXAM: LEFT THUMB 2+V COMPARISON:  None Available. FINDINGS: No acute fracture or dislocation. Joint spaces are maintained. Soft tissues are unremarkable. IMPRESSION: No acute osseous abnormality. Electronically Signed   By: Harrietta Sherry M.D.   On: 12/02/2023 10:14      No Known Allergies  Past Medical History:  Diagnosis Date   Hypertension    Social History   Socioeconomic History   Marital status: Single    Spouse name: Not on file   Number of children: Not on file   Years of education: Not on file   Highest education level: Not on file  Occupational History   Not on file  Tobacco Use   Smoking status: Every Day    Types: Cigarettes   Smokeless tobacco: Never  Vaping Use   Vaping status: Never Used  Substance and Sexual Activity   Alcohol use: Yes    Comment: weekends   Drug use: Not Currently    Types: Marijuana   Sexual activity: Not on file  Other Topics Concern   Not on file  Social History Narrative   Not on file   Social Drivers of Health   Financial Resource Strain: Not on file  Food Insecurity: Not on file  Transportation Needs: Not on file  Physical Activity: Not on file  Stress: Not on file  Social Connections: Not  on file   Family History  Adopted: Yes  Problem Relation Age of Onset   Mental illness Father    Past Surgical History:  Procedure Laterality Date   ANKLE SURGERY     WRIST SURGERY     Nerve Repair, right wrist       Rolinda Rogue, MD 12/02/23 1118
